# Patient Record
Sex: Female | Born: 1995 | Race: Black or African American | Hispanic: No | Marital: Single | Smoking: Never smoker
Health system: Southern US, Community
[De-identification: ages and names within clinical notes are randomized; demographics above are authoritative.]

## PROBLEM LIST (undated history)

## (undated) DIAGNOSIS — Z8759 Personal history of other complications of pregnancy, childbirth and the puerperium: Secondary | ICD-10-CM

## (undated) DIAGNOSIS — Z3490 Encounter for supervision of normal pregnancy, unspecified, unspecified trimester: Secondary | ICD-10-CM

## (undated) DIAGNOSIS — F419 Anxiety disorder, unspecified: Secondary | ICD-10-CM

## (undated) DIAGNOSIS — G43909 Migraine, unspecified, not intractable, without status migrainosus: Secondary | ICD-10-CM

## (undated) HISTORY — PX: OTHER SURGICAL HISTORY: SHX169

## (undated) HISTORY — DX: Personal history of other complications of pregnancy, childbirth and the puerperium: Z87.59

## (undated) HISTORY — DX: Migraine, unspecified, not intractable, without status migrainosus: G43.909

## (undated) HISTORY — DX: Anxiety disorder, unspecified: F41.9

---

## 2015-12-28 ENCOUNTER — Encounter (HOSPITAL_COMMUNITY): Payer: Self-pay | Admitting: Emergency Medicine

## 2015-12-28 ENCOUNTER — Ambulatory Visit (HOSPITAL_COMMUNITY)
Admission: EM | Admit: 2015-12-28 | Discharge: 2015-12-28 | Disposition: A | Discharge status: Home / Self Care | Payer: No Typology Code available for payment source | Attending: Family Medicine | Admitting: Family Medicine

## 2015-12-28 DIAGNOSIS — R51 Headache: Secondary | ICD-10-CM | POA: Diagnosis not present

## 2015-12-28 DIAGNOSIS — R519 Headache, unspecified: Secondary | ICD-10-CM

## 2015-12-28 MED ORDER — KETOROLAC TROMETHAMINE 30 MG/ML IJ SOLN
30.0000 mg | Freq: Once | INTRAMUSCULAR | Status: AC
  Administered 2015-12-28: 30 mg via INTRAMUSCULAR

## 2015-12-28 MED ORDER — TRAZODONE HCL 50 MG PO TABS: 50.0000 mg | ORAL_TABLET | Freq: Every evening | ORAL | Status: DC | PRN

## 2015-12-28 MED ORDER — METHYLPREDNISOLONE ACETATE 80 MG/ML IJ SUSP
INTRAMUSCULAR | Status: AC
  Filled 2015-12-28: qty 1

## 2015-12-28 MED ORDER — ONDANSETRON 4 MG PO TBDP
ORAL_TABLET | ORAL | Status: AC
  Filled 2015-12-28: qty 1

## 2015-12-28 MED ORDER — ONDANSETRON 4 MG PO TBDP
4.0000 mg | ORAL_TABLET | Freq: Once | ORAL | Status: AC
  Administered 2015-12-28: 4 mg via ORAL

## 2015-12-28 MED ORDER — METHYLPREDNISOLONE ACETATE 80 MG/ML IJ SUSP
80.0000 mg | Freq: Once | INTRAMUSCULAR | Status: AC
  Administered 2015-12-28: 80 mg via INTRAMUSCULAR

## 2015-12-28 MED ORDER — KETOROLAC TROMETHAMINE 30 MG/ML IJ SOLN
INTRAMUSCULAR | Status: AC
  Filled 2015-12-28: qty 1

## 2015-12-28 NOTE — ED Notes (Signed)
Complains of severe headache for 4 days.

## 2015-12-28 NOTE — ED Notes (Signed)
Verified with dr Artis Flockkindl a note to be provided for today

## 2015-12-28 NOTE — ED Provider Notes (Signed)
CSN: [id number]     Arrival date & time 12/28/15  1259 History   First MD Initiated Contact with Patient 12/28/15 1328     No chief complaint on file.  (Consider location/radiation/quality/duration/timing/severity/associated sxs/prior Treatment) Patient is a 20 y.o. female presenting with headaches. The history is provided by the patient.  Headache Pain location:  Frontal Quality:  Dull Radiates to:  Eyes Onset quality:  Gradual Duration:  5 days Progression:  Unchanged Chronicity:  New Similar to prior headaches: no   Relieved by:  None tried Worsened by:  Nothing Ineffective treatments:  None tried Associated symptoms: nausea and sinus pressure   Associated symptoms: no abdominal pain, no blurred vision, no diarrhea, no dizziness, no drainage, no neck stiffness, no numbness, no paresthesias, no photophobia, no vomiting and no weakness     No past medical history on file. No past surgical history on file. No family history on file. Social History  Substance Use Topics  . Smoking status: Not on file  . Smokeless tobacco: Not on file  . Alcohol Use: Not on file   OB History    No data available     Review of Systems  Constitutional: Negative.   HENT: Positive for sinus pressure. Negative for postnasal drip.   Eyes: Negative for blurred vision and photophobia.  Respiratory: Negative.   Gastrointestinal: Positive for nausea. Negative for vomiting, abdominal pain and diarrhea.  Genitourinary: Negative.   Musculoskeletal: Negative for neck stiffness.  Neurological: Positive for headaches. Negative for dizziness, syncope, facial asymmetry, weakness, light-headedness, numbness and paresthesias.  All other systems reviewed and are negative.   Allergies  Review of patient's allergies indicates not on file.  Home Medications   Prior to Admission medications   Not on File   Meds Ordered and Administered this Visit   Medications  ketorolac (TORADOL) 30 MG/ML injection  30 mg (not administered)  methylPREDNISolone acetate (DEPO-MEDROL) injection 80 mg (not administered)  ondansetron (ZOFRAN-ODT) disintegrating tablet 4 mg (not administered)    BP 123/81 mmHg  Pulse 99  Temp(Src) 98.4 F (36.9 C) (Oral)  Resp 16  SpO2 100% No data found.   Physical Exam  Constitutional: She is oriented to person, place, and time. She appears well-developed and well-nourished. No distress.  HENT:  Head: Normocephalic.  Right Ear: External ear normal.  Left Ear: External ear normal.  Mouth/Throat: Oropharynx is clear and moist.  Eyes: Conjunctivae and EOM are normal. Pupils are equal, round, and reactive to light.  Neck: Normal range of motion. Neck supple.  Cardiovascular: Normal heart sounds.   Pulmonary/Chest: Breath sounds normal.  Abdominal: Soft. Bowel sounds are normal. There is no tenderness.  Lymphadenopathy:    She has no cervical adenopathy.  Neurological: She is alert and oriented to person, place, and time. She displays normal reflexes. No cranial nerve deficit. She exhibits normal muscle tone. Coordination normal.  Skin: Skin is warm and dry.  Nursing note and vitals reviewed.   ED Course  Procedures (including critical care time)  Labs Review Labs Reviewed - No data to display  Imaging Review No results found.   Visual Acuity Review  Right Eye Distance:   Left Eye Distance:   Bilateral Distance:    Right Eye Near:   Left Eye Near:    Bilateral Near:         MDM  No diagnosis found. Meds ordered this encounter  Medications  . ketorolac (TORADOL) 30 MG/ML injection 30 mg    Sig:   .  methylPREDNISolone acetate (DEPO-MEDROL) injection 80 mg    Sig:   . ondansetron (ZOFRAN-ODT) disintegrating tablet 4 mg    Sig:   . traZODone (DESYREL) 50 MG tablet    Sig: Take 1 tablet (50 mg total) by mouth at bedtime as needed for sleep.    Dispense:  10 tablet    Refill:  1       Linna HoffJames D Victormanuel Mclure, MD 12/28/15 1346

## 2016-12-14 ENCOUNTER — Ambulatory Visit (HOSPITAL_COMMUNITY)
Admission: EM | Admit: 2016-12-14 | Discharge: 2016-12-14 | Disposition: A | Discharge status: Home / Self Care | Payer: BLUE CROSS/BLUE SHIELD | Attending: Family Medicine | Admitting: Family Medicine

## 2016-12-14 ENCOUNTER — Encounter (HOSPITAL_COMMUNITY): Payer: Self-pay | Admitting: Emergency Medicine

## 2016-12-14 DIAGNOSIS — J209 Acute bronchitis, unspecified: Secondary | ICD-10-CM | POA: Diagnosis not present

## 2016-12-14 HISTORY — DX: Encounter for supervision of normal pregnancy, unspecified, unspecified trimester: Z34.90

## 2016-12-14 MED ORDER — AZITHROMYCIN 250 MG PO TABS: 250.0000 mg | ORAL_TABLET | Freq: Every day | ORAL | 0 refills | Status: DC

## 2016-12-14 NOTE — ED Triage Notes (Addendum)
Coughing and wheezing started Sunday.  Patient has runny nose and post nasal drip and sneezing.    Patient is pregnant, [facility] 03/25/2017

## 2016-12-14 NOTE — ED Provider Notes (Signed)
CSN: [id number]     Arrival date & time 12/14/16  1230 History   First MD Initiated Contact with Patient 12/14/16 1253     Chief Complaint  Patient presents with  . Cough   (Consider location/radiation/quality/duration/timing/severity/associated sxs/prior Treatment) Patient c/o cough and uri sx's for a week.  She is [redacted] weeks pregnant.   The history is provided by the patient.  Cough  Cough characteristics:  Productive Sputum characteristics:  White Severity:  Mild Onset quality:  Sudden Duration:  1 day Timing:  Constant Progression:  Worsening Chronicity:  New Relieved by:  Nothing Worsened by:  Nothing   Past Medical History:  Diagnosis Date  . Pregnant    Past Surgical History:  Procedure Laterality Date  . adnoids     No family history on file. Social History  Substance Use Topics  . Smoking status: Never Smoker  . Smokeless tobacco: Not on file  . Alcohol use No   OB History    No data available     Review of Systems  Constitutional: Negative.   HENT: Negative.   Eyes: Negative.   Respiratory: Positive for cough.   Cardiovascular: Negative.   Gastrointestinal: Negative.   Endocrine: Negative.   Genitourinary: Negative.   Musculoskeletal: Negative.   Allergic/Immunologic: Negative.   Neurological: Negative.   Hematological: Negative.     Allergies  Patient has no known allergies.  Home Medications   Prior to Admission medications   Medication Sig Start Date End Date Taking? Authorizing Provider  azithromycin (ZITHROMAX) 250 MG tablet Take 1 tablet (250 mg total) by mouth daily. Take first 2 tablets together, then 1 every day until finished. 12/14/16   [name], FNP  citalopram (CELEXA) 20 MG tablet Take 20 mg by mouth daily.    Historical Provider, MD  ibuprofen (ADVIL,MOTRIN) 800 MG tablet Take 800 mg by mouth every 8 (eight) hours as needed.    Historical Provider, MD  traZODone (DESYREL) 50 MG tablet Take 1 tablet (50 mg total) by  mouth at bedtime as needed for sleep. 12/28/15   Linna Hoff, MD   Meds Ordered and Administered this Visit  Medications - No data to display  Pulse 85   Temp 99 F (37.2 C) (Oral)   Resp 18   LMP 06/18/2016   SpO2 100%  No data found.   Physical Exam  Constitutional: She is oriented to person, place, and time. She appears well-developed and well-nourished.  HENT:  Head: Normocephalic and atraumatic.  Eyes: Conjunctivae and EOM are normal. Pupils are equal, round, and reactive to light.  Neck: Normal range of motion. Neck supple.  Cardiovascular: Normal rate, regular rhythm and normal heart sounds.   Pulmonary/Chest: Effort normal and breath sounds normal.  Abdominal: Soft.  Musculoskeletal: Normal range of motion.  Neurological: She is alert and oriented to person, place, and time.  Nursing note and vitals reviewed.   Urgent Care Course     Procedures (including critical care time)  Labs Review Labs Reviewed - No data to display  Imaging Review No results found.   Visual Acuity Review  Right Eye Distance:   Left Eye Distance:   Bilateral Distance:    Right Eye Near:   Left Eye Near:    Bilateral Near:         MDM   1. Acute bronchitis, unspecified organism    zpak Delsym otc Push po fluids, rest, tylenol and motrin otc prn as directed for fever, arthralgias, and  myalgias.  Follow up prn if sx's continue or persist.    [name], FNP 12/14/16 1340

## 2017-01-16 ENCOUNTER — Encounter (HOSPITAL_COMMUNITY): Payer: Self-pay | Admitting: Emergency Medicine

## 2017-01-16 ENCOUNTER — Emergency Department (HOSPITAL_COMMUNITY)
Admission: EM | Admit: 2017-01-16 | Discharge: 2017-01-17 | Disposition: A | Discharge status: Home / Self Care | Payer: BLUE CROSS/BLUE SHIELD | Attending: Emergency Medicine | Admitting: Emergency Medicine

## 2017-01-16 DIAGNOSIS — R109 Unspecified abdominal pain: Secondary | ICD-10-CM | POA: Diagnosis not present

## 2017-01-16 DIAGNOSIS — Z3A30 30 weeks gestation of pregnancy: Secondary | ICD-10-CM | POA: Insufficient documentation

## 2017-01-16 DIAGNOSIS — Z5321 Procedure and treatment not carried out due to patient leaving prior to being seen by health care provider: Secondary | ICD-10-CM | POA: Insufficient documentation

## 2017-01-16 DIAGNOSIS — O26893 Other specified pregnancy related conditions, third trimester: Secondary | ICD-10-CM | POA: Diagnosis present

## 2017-01-16 LAB — URINALYSIS, ROUTINE W REFLEX MICROSCOPIC
Bilirubin Urine: NEGATIVE
GLUCOSE, UA: NEGATIVE mg/dL
HGB URINE DIPSTICK: NEGATIVE
Ketones, ur: 20 mg/dL — AB
LEUKOCYTES UA: NEGATIVE
Nitrite: NEGATIVE
PH: 6 (ref 5.0–8.0)
PROTEIN: NEGATIVE mg/dL
SPECIFIC GRAVITY, URINE: 1.013 (ref 1.005–1.030)

## 2017-01-16 LAB — CBC
HEMATOCRIT: 34.6 % — AB (ref 36.0–46.0)
Hemoglobin: 11.6 g/dL — ABNORMAL LOW (ref 12.0–15.0)
MCH: 30.3 pg (ref 26.0–34.0)
MCHC: 33.5 g/dL (ref 30.0–36.0)
MCV: 90.3 fL (ref 78.0–100.0)
PLATELETS: 200 10*3/uL (ref 150–400)
RBC: 3.83 MIL/uL — ABNORMAL LOW (ref 3.87–5.11)
RDW: 12.6 % (ref 11.5–15.5)
WBC: 7.7 10*3/uL (ref 4.0–10.5)

## 2017-01-16 NOTE — ED Triage Notes (Signed)
Pt to ED c/o constant sharp pains in abdomen starting today - pt is [redacted] weeks pregnant (EDD 03/25/17). Has been receiving prenatal care. Pt reports the pain started above her belly button and then progressed to below it and now all over. Pt denies vaginal discharge/bleeding/N/V/D. She does endorse pain when urinating, but can't describe it.

## 2017-01-17 LAB — COMPREHENSIVE METABOLIC PANEL
ALBUMIN: 3 g/dL — AB (ref 3.5–5.0)
ALT: 13 U/L — AB (ref 14–54)
AST: 18 U/L (ref 15–41)
Alkaline Phosphatase: 62 U/L (ref 38–126)
Anion gap: 8 (ref 5–15)
BUN: 6 mg/dL (ref 6–20)
CHLORIDE: 106 mmol/L (ref 101–111)
CO2: 20 mmol/L — AB (ref 22–32)
CREATININE: 0.54 mg/dL (ref 0.44–1.00)
Calcium: 8.4 mg/dL — ABNORMAL LOW (ref 8.9–10.3)
GFR calc Af Amer: >60 mL/min (ref >60)
GFR calc non Af Amer: >60 mL/min (ref >60)
GLUCOSE: 72 mg/dL (ref 65–99)
Potassium: 3.4 mmol/L — ABNORMAL LOW (ref 3.5–5.1)
SODIUM: 134 mmol/L — AB (ref 135–145)
Total Bilirubin: 0.4 mg/dL (ref 0.3–1.2)
Total Protein: 6.2 g/dL — ABNORMAL LOW (ref 6.5–8.1)

## 2017-01-17 LAB — HCG, QUANTITATIVE, PREGNANCY: hCG, Beta Chain, Quant, S: 11658 m[IU]/mL — ABNORMAL HIGH (ref <5)

## 2017-01-17 LAB — LIPASE, BLOOD: LIPASE: 18 U/L (ref 11–51)

## 2017-01-17 MED ORDER — SODIUM CHLORIDE 0.9 % IV SOLN: Freq: Once | INTRAVENOUS | Status: DC

## 2017-01-17 NOTE — ED Notes (Signed)
Patient is not in department

## 2017-01-17 NOTE — ED Notes (Signed)
Patient didn't answer when called for room x 2.

## 2017-02-21 ENCOUNTER — Institutional Professional Consult (permissible substitution): Payer: Self-pay | Admitting: Pediatrics

## 2017-02-24 ENCOUNTER — Ambulatory Visit (INDEPENDENT_AMBULATORY_CARE_PROVIDER_SITE_OTHER): Payer: Self-pay | Admitting: Pediatrics

## 2017-02-24 DIAGNOSIS — Z7681 Expectant parent(s) prebirth pediatrician visit: Secondary | ICD-10-CM

## 2017-02-25 NOTE — Progress Notes (Signed)
Prenatal counseling for impending newborn done--1st child, currently 36wks, no complications, prenatal at 10 wks Z76.81

## 2017-06-02 ENCOUNTER — Encounter (HOSPITAL_COMMUNITY): Payer: Self-pay | Admitting: *Deleted

## 2017-06-02 DIAGNOSIS — F419 Anxiety disorder, unspecified: Secondary | ICD-10-CM | POA: Diagnosis not present

## 2017-06-02 DIAGNOSIS — Z5321 Procedure and treatment not carried out due to patient leaving prior to being seen by health care provider: Secondary | ICD-10-CM | POA: Diagnosis not present

## 2017-06-02 DIAGNOSIS — R51 Headache: Secondary | ICD-10-CM | POA: Diagnosis present
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
## 2017-06-03 ENCOUNTER — Emergency Department (HOSPITAL_COMMUNITY)
Admission: EM | Admit: 2017-06-03 | Discharge: 2017-06-03 | Disposition: A | Discharge status: Home / Self Care | Payer: BLUE CROSS/BLUE SHIELD | Attending: Emergency Medicine | Admitting: Emergency Medicine

## 2017-06-03 NOTE — ED Notes (Signed)
Pt in adult waiting

## 2018-07-25 ENCOUNTER — Encounter (HOSPITAL_COMMUNITY): Payer: Self-pay | Admitting: Emergency Medicine

## 2018-07-25 ENCOUNTER — Ambulatory Visit (HOSPITAL_COMMUNITY)
Admission: EM | Admit: 2018-07-25 | Discharge: 2018-07-25 | Disposition: A | Discharge status: Home / Self Care | Payer: Managed Care, Other (non HMO) | Attending: Family Medicine | Admitting: Family Medicine

## 2018-07-25 DIAGNOSIS — G43009 Migraine without aura, not intractable, without status migrainosus: Secondary | ICD-10-CM

## 2018-07-25 MED ORDER — NAPROXEN 500 MG PO TABS: 500.0000 mg | ORAL_TABLET | Freq: Two times a day (BID) | ORAL | 0 refills | Status: DC

## 2018-07-25 MED ORDER — KETOROLAC TROMETHAMINE 60 MG/2ML IM SOLN
60.0000 mg | Freq: Once | INTRAMUSCULAR | Status: AC
  Administered 2018-07-25: 60 mg via INTRAMUSCULAR

## 2018-07-25 MED ORDER — KETOROLAC TROMETHAMINE 60 MG/2ML IM SOLN
INTRAMUSCULAR | Status: AC
  Filled 2018-07-25: qty 2

## 2018-07-25 NOTE — ED Triage Notes (Signed)
Triaged by provider  

## 2018-07-25 NOTE — Discharge Instructions (Signed)
We gave you a shot of Toradol today, this should begin working approximately 30 to 40 minutes May continue to use Naprosyn twice daily with food on your stomach for further headache management Please follow-up if headache not resolving, worsening, developing vision changes, neck stiffness, fevers, dizziness, lightheadedness

## 2018-07-26 NOTE — ED Provider Notes (Signed)
[facility]    CSN: [id number] Arrival date & time: 07/25/18  1317     History   Chief Complaint Chief Complaint  Patient presents with  . Migraine    HPI Christina Berry is a 22 y.o. female no significant past medical history presenting today for evaluation of headache.  Patient states that she has had a persistent headache since Monday over the past 2 to 3 days.  Onset has been gradual.  Feels like a typical migraine.  Has not been relieved with over-the-counter medicines like other milder headaches.  She states that she has had a similar migraine previous to this a few years ago that was treated with an injection that helped her symptoms.  Headache is located at the right temple.  Does not have associated photophobia or phonophobia.  Denies nausea or vomiting.  Denies changes in vision.  Denies neck stiffness or fever.  He has tried ibuprofen with mild relief, but symptoms persist.  Patient is currently on her menstrual cycle, denies pregnancy despite chart noting this.  HPI  Past Medical History:  Diagnosis Date  . Pregnant     There are no active problems to display for this patient.   Past Surgical History:  Procedure Laterality Date  . adnoids      OB History    Gravida  1   Para      Term      Preterm      AB      Living        SAB      TAB      Ectopic      Multiple      Live Births               Home Medications    Prior to Admission medications   Medication Sig Start Date End Date Taking? Authorizing Provider  azithromycin (ZITHROMAX) 250 MG tablet Take 1 tablet (250 mg total) by mouth daily. Take first 2 tablets together, then 1 every day until finished. 12/14/16   [name], FNP  citalopram (CELEXA) 20 MG tablet Take 20 mg by mouth daily.    [provider]  ibuprofen (ADVIL,MOTRIN) 800 MG tablet Take 800 mg by mouth every 8 (eight) hours as needed.    [provider]  naproxen (NAPROSYN) 500 MG  tablet Take 1 tablet (500 mg total) by mouth 2 (two) times daily. 07/25/18   Terek Bee C, PA-C  traZODone (DESYREL) 50 MG tablet Take 1 tablet (50 mg total) by mouth at bedtime as needed for sleep. 12/28/15   Linna Hoff, MD    Family History No family history on file.  Social History Social History   Tobacco Use  . Smoking status: Never Smoker  . Smokeless tobacco: Never Used  Substance Use Topics  . Alcohol use: No  . Drug use: No     Allergies   Patient has no known allergies.   Review of Systems Review of Systems  Constitutional: Negative for fatigue and fever.  HENT: Negative for congestion, sinus pressure and sore throat.   Eyes: Negative for photophobia, pain and visual disturbance.  Respiratory: Negative for cough and shortness of breath.   Cardiovascular: Negative for chest pain.  Gastrointestinal: Negative for abdominal pain, nausea and vomiting.  Genitourinary: Negative for decreased urine volume and hematuria.  Musculoskeletal: Negative for myalgias, neck pain and neck stiffness.  Neurological: Positive for headaches. Negative for dizziness, syncope, facial asymmetry,  speech difficulty, weakness, light-headedness and numbness.     Physical Exam Triage Vital Signs ED Triage Vitals  Enc Vitals Group     BP 07/25/18 1436 119/71     Pulse --      Resp 07/25/18 1436 18     Temp 07/25/18 1436 98.4 F (36.9 C)     Temp Source 07/25/18 1436 Oral     SpO2 07/25/18 1436 100 %     Weight --      Height --      Head Circumference --      Peak Flow --      Pain Score 07/25/18 1513 8     Pain Loc --      Pain Edu? --      Excl. in GC? --    No data found.  Updated Vital Signs BP 119/71 (BP Location: Left Arm)   Temp 98.4 F (36.9 C) (Oral)   Resp 18   LMP 07/25/2018   SpO2 100%   Breastfeeding? Unknown   Visual Acuity Right Eye Distance:   Left Eye Distance:   Bilateral Distance:    Right Eye Near:   Left Eye Near:    Bilateral Near:       Physical Exam  Constitutional: She is oriented to person, place, and time. She appears well-developed and well-nourished. No distress.  HENT:  Head: Normocephalic and atraumatic.  Bilateral ears without tenderness to palpation of external auricle, tragus and mastoid, EAC's without erythema or swelling, TM's with good bony landmarks and cone of light. Non erythematous.  Oral mucosa pink and moist, no tonsillar enlargement or exudate. Posterior pharynx patent and nonerythematous, no uvula deviation or swelling. Normal phonation.   Eyes: Pupils are equal, round, and reactive to light. Conjunctivae and EOM are normal.  Neck: Normal range of motion. Neck supple.  Cardiovascular: Normal rate and regular rhythm.  No murmur heard. Pulmonary/Chest: Effort normal and breath sounds normal. No respiratory distress.  Breathing comfortably at rest, CTABL, no wheezing, rales or other adventitious sounds auscultated  Abdominal: Soft. There is no tenderness.  Musculoskeletal: She exhibits no edema.  Neurological: She is alert and oriented to person, place, and time.  Patient A&O x3, cranial nerves II-XII grossly intact, strength at shoulders, hips and knees 5/5, equal bilaterally, patellar reflex 2+ bilaterally. Gait without abnormality.  Skin: Skin is warm and dry.  Psychiatric: She has a normal mood and affect.  Nursing note and vitals reviewed.    UC Treatments / Results  Labs (all labs ordered are listed, but only abnormal results are displayed) Labs Reviewed - No data to display  EKG None  Radiology No results found.  Procedures Procedures (including critical care time)  Medications Ordered in UC Medications  ketorolac (TORADOL) injection 60 mg (60 mg Intramuscular Given 07/25/18 1513)    Initial Impression / Assessment and Plan / UC Course  I have reviewed the triage vital signs and the nursing notes.  Pertinent labs & imaging results that were available during my care of the  patient were reviewed by me and considered in my medical decision making (see chart for details).     Patient with migraine, no neuro deficit, vital signs stable, will treat with Toradol today.  Continue Naprosyn at home.Discussed strict return precautions. Patient verbalized understanding and is agreeable with plan.  Final Clinical Impressions(s) / UC Diagnoses   Final diagnoses:  Migraine without aura and without status migrainosus, not intractable     Discharge Instructions  We gave you a shot of Toradol today, this should begin working approximately 30 to 40 minutes May continue to use Naprosyn twice daily with food on your stomach for further headache management Please follow-up if headache not resolving, worsening, developing vision changes, neck stiffness, fevers, dizziness, lightheadedness   ED Prescriptions    Medication Sig Dispense Auth. Provider   naproxen (NAPROSYN) 500 MG tablet Take 1 tablet (500 mg total) by mouth 2 (two) times daily. 30 tablet Cobi Delph, [place] C, PA-C     Controlled Substance Prescriptions  Controlled Substance Registry consulted? Not Applicable   Lew Dawes, [place] 07/26/18 1001

## 2019-07-19 ENCOUNTER — Other Ambulatory Visit: Payer: Self-pay

## 2019-07-19 ENCOUNTER — Encounter: Payer: Self-pay | Admitting: Family Medicine

## 2019-07-19 ENCOUNTER — Ambulatory Visit (INDEPENDENT_AMBULATORY_CARE_PROVIDER_SITE_OTHER): Payer: Medicaid Other | Admitting: Family Medicine

## 2019-07-19 VITALS — BP 107/68 | HR 93 | Temp 97.9°F | Ht 69.0 in | Wt 148.0 lb

## 2019-07-19 DIAGNOSIS — G43809 Other migraine, not intractable, without status migrainosus: Secondary | ICD-10-CM

## 2019-07-19 DIAGNOSIS — Z8759 Personal history of other complications of pregnancy, childbirth and the puerperium: Secondary | ICD-10-CM

## 2019-07-19 DIAGNOSIS — F419 Anxiety disorder, unspecified: Secondary | ICD-10-CM

## 2019-07-19 DIAGNOSIS — Z0000 Encounter for general adult medical examination without abnormal findings: Secondary | ICD-10-CM

## 2019-07-19 DIAGNOSIS — N912 Amenorrhea, unspecified: Secondary | ICD-10-CM | POA: Diagnosis not present

## 2019-07-19 DIAGNOSIS — Z09 Encounter for follow-up examination after completed treatment for conditions other than malignant neoplasm: Secondary | ICD-10-CM

## 2019-07-19 DIAGNOSIS — Z7689 Persons encountering health services in other specified circumstances: Secondary | ICD-10-CM | POA: Diagnosis not present

## 2019-07-19 LAB — POCT URINALYSIS DIPSTICK
Glucose, UA: NEGATIVE
Leukocytes, UA: NEGATIVE
Nitrite, UA: NEGATIVE
Protein, UA: NEGATIVE
Spec Grav, UA: >=1.03 — AB (ref 1.010–1.025)
Urobilinogen, UA: 0.2 E.U./dL
pH, UA: 5.5 (ref 5.0–8.0)

## 2019-07-19 LAB — POCT URINE PREGNANCY: Preg Test, Ur: NEGATIVE

## 2019-07-19 MED ORDER — IBUPROFEN 800 MG PO TABS: 800.0000 mg | ORAL_TABLET | Freq: Three times a day (TID) | ORAL | 3 refills | Status: AC | PRN

## 2019-07-19 MED ORDER — BUTALBITAL-APAP-CAFFEINE 50-325-40 MG PO TABS: 1.0000 | ORAL_TABLET | Freq: Four times a day (QID) | ORAL | 0 refills | Status: AC | PRN

## 2019-07-19 MED ORDER — BUSPIRONE HCL 5 MG PO TABS: 5.0000 mg | ORAL_TABLET | Freq: Two times a day (BID) | ORAL | 2 refills | Status: DC

## 2019-07-19 NOTE — Progress Notes (Signed)
Patient [facility]  New Patient--Hospital Follow Up--Establish Care  Subjective:  Patient ID: Christina Berry, female    DOB: Aug 26, 1996  Age: 23 y.o. MRN: [id number]  CC:  Chief Complaint  Patient presents with  . Establish Care  . Migraine    HPI Christina Berry is a 23 year old female who presents  Hospital Follow Up and to [place] today.   Past Medical History:  Diagnosis Date  . Migraine   . Miscarriage within last 12 months   . Pregnant    Current Status: This will be Christina Berry's initial office visit with me. She was previously seeing her Pediatrician for her PCP needs. Since her last office visit she recently had a miscarriage 3 and 1/2 weeks ago. She was [redacted] weeks pregnant at the time. She has a 10 year old son.  She denies vaginal pain or bleeding, and cramping today. She has follow up with OB-GYN on 07/24/2019. Her was previously using the Patch for birth control when she became pregnant.    She denies fevers, chills, fatigue, recent infections, weight loss, and night sweats. She has not had any headaches, visual changes, dizziness, and falls. No chest pain, heart palpitations, cough and shortness of breath reported. No reports of GI problems such as nausea, vomiting, diarrhea, and constipation. She has no reports of blood in stools, dysuria and hematuria. No depression or anxiety, and denies suicidal ideations, homicidal ideations, or auditory hallucinations. She denies pain today.   Past Surgical History:  Procedure Laterality Date  . adnoids      History reviewed. No pertinent family history.  Social History   Socioeconomic History  . Marital status: Single    Spouse name: Not on file  . Number of children: Not on file  . Years of education: Not on file  . Highest education level: Not on file  Occupational History  . Not on file  Social Needs  . Financial resource strain: Not on file  . Food insecurity    Worry: Not on  file    Inability: Not on file  . Transportation needs    Medical: Not on file    Non-medical: Not on file  Tobacco Use  . Smoking status: Never Smoker  . Smokeless tobacco: Never Used  Substance and Sexual Activity  . Alcohol use: No  . Drug use: No  . Sexual activity: Yes    Partners: Male    Birth control/protection: Patch  Lifestyle  . Physical activity    Days per week: Not on file    Minutes per session: Not on file  . Stress: Not on file  Relationships  . Social Herbalist on phone: Not on file    Gets together: Not on file    Attends religious service: Not on file    Active member of club or organization: Not on file    Attends meetings of clubs or organizations: Not on file    Relationship status: Not on file  . Intimate partner violence    Fear of current or ex partner: Not on file    Emotionally abused: Not on file    Physically abused: Not on file    Forced sexual activity: Not on file  Other Topics Concern  . Not on file  Social History Narrative  . Not on file    Outpatient Medications Prior to Visit  Medication Sig Dispense Refill  . ibuprofen (ADVIL,MOTRIN) 800  MG tablet Take 800 mg by mouth every 8 (eight) hours as needed.    . traZODone (DESYREL) 50 MG tablet Take 1 tablet (50 mg total) by mouth at bedtime as needed for sleep. (Patient not taking: Reported on 07/19/2019) 10 tablet 1  . azithromycin (ZITHROMAX) 250 MG tablet Take 1 tablet (250 mg total) by mouth daily. Take first 2 tablets together, then 1 every day until finished. 6 tablet 0  . citalopram (CELEXA) 20 MG tablet Take 20 mg by mouth daily.    . naproxen (NAPROSYN) 500 MG tablet Take 1 tablet (500 mg total) by mouth 2 (two) times daily. 30 tablet 0   No facility-administered medications prior to visit.     Allergies  Allergen Reactions  . Cat Hair Extract Swelling  . Seasonal Ic  [Cholestatin] Swelling    ROS Review of Systems  Constitutional: Negative.   HENT:  Negative.   Eyes: Negative.   Respiratory: Negative.   Cardiovascular: Negative.   Gastrointestinal: Negative.   Endocrine: Negative.   Genitourinary: Negative.   Musculoskeletal: Negative.   Skin: Negative.   Allergic/Immunologic: Negative.   Neurological: Positive for headaches (Migraine).  Hematological: Negative.   Psychiatric/Behavioral: Negative.    Objective:    Physical Exam  Constitutional: She is oriented to person, place, and time. She appears well-developed and well-nourished.  HENT:  Head: Normocephalic and atraumatic.  Eyes: Conjunctivae are normal.  Neck: Normal range of motion. Neck supple.  Cardiovascular: Normal rate, regular rhythm, normal heart sounds and intact distal pulses.  Pulmonary/Chest: Effort normal and breath sounds normal.  Abdominal: Soft. Bowel sounds are normal.  Musculoskeletal: Normal range of motion.  Neurological: She is alert and oriented to person, place, and time. She has normal reflexes.  Skin: Skin is warm and dry.  Psychiatric: She has a normal mood and affect. Her behavior is normal. Judgment and thought content normal.  Nursing note and vitals reviewed.   BP 107/68   Pulse 93   Temp 97.9 F (36.6 C) (Oral)   Ht _0  (1.753 m)   Wt 148 lb (67.1 kg)   LMP 03/27/2019 (Approximate)   SpO2 99%   BMI 21.86 kg/m  Wt Readings from Last 3 Encounters:  07/19/19 148 lb (67.1 kg)  06/02/17 138 lb (62.6 kg)    Health Maintenance Due  Topic Date Due  . HIV Screening  11/05/2010  . TETANUS/TDAP  11/05/2014  . PAP-Cervical Cytology Screening  11/04/2016  . PAP SMEAR-Modifier  11/04/2016  . INFLUENZA VACCINE  04/06/2019    There are no preventive care reminders to display for this patient.  No results found for: TSH Lab Results  Component Value Date   WBC 4.4 07/19/2019   HGB 13.2 07/19/2019   HCT 40.3 07/19/2019   MCV 91 07/19/2019   PLT 217 07/19/2019   Lab Results  Component Value Date   NA 139 07/19/2019   K 4.0  07/19/2019   CO2 22 07/19/2019   GLUCOSE 84 07/19/2019   BUN 10 07/19/2019   CREATININE 0.81 07/19/2019   BILITOT 0.4 07/19/2019   ALKPHOS 56 07/19/2019   AST 11 07/19/2019   ALT 6 07/19/2019   PROT 6.8 07/19/2019   ALBUMIN 4.5 07/19/2019   CALCIUM 9.7 07/19/2019   ANIONGAP 8 01/16/2017   No results found for: CHOL No results found for: HDL No results found for: LDLCALC No results found for: TRIG No results found for: CHOLHDL No results found for: HGBA1C  Assessment & Plan:  1. Hospital discharge follow-up  2. Encounter to establish care  3. History of miscarriage She is doing well today.   4. Absent menstruation Urine pregnancy test is negative for today.  - POCT urine pregnancy  5. Other migraine without status migrainosus, not intractable We will initiate Fioricet today.  - butalbital-acetaminophen-caffeine (FIORICET) 50-325-40 MG tablet; Take 1-2 tablets by mouth every 6 (six) hours as needed for headache.  Dispense: 20 tablet; Refill: 0  6. Anxiety Mild today. We will initiate Buspirone today.  - busPIRone (BUSPAR) 5 MG tablet; Take 1 tablet (5 mg total) by mouth 2 (two) times daily.  Dispense: 60 tablet; Refill: 2  7. Healthcare maintenance - CBC with Differential - Comp Met (CMET) - Urinalysis Dipstick - POCT urine pregnancy  8. Follow up She will follow up in 2 months.   Meds ordered this encounter  Medications  . ibuprofen (ADVIL) 800 MG tablet    Sig: Take 1 tablet (800 mg total) by mouth every 8 (eight) hours as needed for headache.    Dispense:  30 tablet    Refill:  3  . busPIRone (BUSPAR) 5 MG tablet    Sig: Take 1 tablet (5 mg total) by mouth 2 (two) times daily.    Dispense:  60 tablet    Refill:  2  . butalbital-acetaminophen-caffeine (FIORICET) 50-325-40 MG tablet    Sig: Take 1-2 tablets by mouth every 6 (six) hours as needed for headache.    Dispense:  20 tablet    Refill:  0    Orders Placed This Encounter  Procedures  . CBC  with Differential  . Comp Met (CMET)  . Urinalysis Dipstick  . POCT urine pregnancy    Referral Orders  No referral(s) requested today    Kathe Becton,  MSN, FNP-BC [address] [place], [place] [address] [phone] fax   Problem List Items Addressed This Visit    None    Visit Diagnoses    Hospital discharge follow-up    -  Primary   Encounter to establish care       History of miscarriage       Absent menstruation       Relevant Orders   POCT urine pregnancy (Completed)   Other migraine without status migrainosus, not intractable       Relevant Medications   ibuprofen (ADVIL) 800 MG tablet   butalbital-acetaminophen-caffeine (FIORICET) 50-325-40 MG tablet   Anxiety       Relevant Medications   busPIRone (BUSPAR) 5 MG tablet   Healthcare maintenance       Relevant Orders   CBC with Differential (Completed)   Comp Met (CMET) (Completed)   Health care maintenance       Relevant Orders   Urinalysis Dipstick (Completed)   POCT urine pregnancy (Completed)   Follow up          Meds ordered this encounter  Medications  . ibuprofen (ADVIL) 800 MG tablet    Sig: Take 1 tablet (800 mg total) by mouth every 8 (eight) hours as needed for headache.    Dispense:  30 tablet    Refill:  3  . busPIRone (BUSPAR) 5 MG tablet    Sig: Take 1 tablet (5 mg total) by mouth 2 (two) times daily.    Dispense:  60 tablet    Refill:  2  . butalbital-acetaminophen-caffeine (FIORICET) 50-325-40 MG tablet  Sig: Take 1-2 tablets by mouth every 6 (six) hours as needed for headache.    Dispense:  20 tablet    Refill:  0    Follow-up: Return in about 2 months (around 09/18/2019).    Azzie Glatter, FNP

## 2019-07-19 NOTE — Patient Instructions (Addendum)
Contraception Choices Contraception, also called birth control, means things to use or ways to try not to get pregnant. Hormonal birth control This kind of birth control uses hormones. Here are some types of hormonal birth control:  A tube that is put under skin of the arm (implant). The tube can stay in for as long as 3 years.  Shots to get every 3 months (injections).  Pills to take every day (birth control pills).  A patch to change 1 time each week for 3 weeks (birth control patch). After that, the patch is taken off for 1 week.  A ring to put in the vagina. The ring is left in for 3 weeks. Then it is taken out of the vagina for 1 week. Then a new ring is put in.  Pills to take after unprotected sex (emergency birth control pills). Barrier birth control Here are some types of barrier birth control:  A thin covering that is put on the penis before sex (female condom). The covering is thrown away after sex.  A soft, loose covering that is put in the vagina before sex (female condom). The covering is thrown away after sex.  A rubber bowl that sits over the cervix (diaphragm). The bowl must be made for you. The bowl is put into the vagina before sex. The bowl is left in for 6-8 hours after sex. It is taken out within 24 hours.  A small, soft cup that fits over the cervix (cervical cap). The cup must be made for you. The cup can be left in for 6-8 hours after sex. It is taken out within 48 hours.  A sponge that is put into the vagina before sex. It must be left in for at least 6 hours after sex. It must be taken out within 30 hours. Then it is thrown away.  A chemical that kills or stops sperm from getting into the uterus (spermicide). It may be a pill, cream, jelly, or foam to put in the vagina. The chemical should be used at least 10-15 minutes before sex. IUD (intrauterine) birth control An IUD is a small, T-shaped piece of plastic. It is put inside the uterus. There are two kinds:   Hormone IUD. This kind can stay in for 3-5 years.  Copper IUD. This kind can stay in for 10 years. Permanent birth control Here are some types of permanent birth control:  Surgery to block the fallopian tubes.  Having an insert put into each fallopian tube.  Surgery to tie off the tubes that carry sperm (vasectomy). Natural planning birth control Here are some types of natural planning birth control:  Not having sex on the days the woman could get pregnant.  Using a calendar: ? To keep track of the length of each period. ? To find out what days pregnancy can happen. ? To plan to not have sex on days when pregnancy can happen.  Watching for symptoms of ovulation and not having sex during ovulation. One way the woman can check for ovulation is to check her temperature.  Waiting to have sex until after ovulation. Summary  Contraception, also called birth control, means things to use or ways to try not to get pregnant.  Hormonal methods of birth control include implants, injections, pills, patches, vaginal rings, and emergency birth control pills.  Barrier methods of birth control can include female condoms, female condoms, diaphragms, cervical caps, sponges, and spermicides.  There are two types of IUD (intrauterine device) birth control.  An IUD can be put in a woman's uterus to prevent pregnancy for 3-5 years.  Permanent sterilization can be done through a procedure for males, females, or both.  Natural planning methods involve not having sex on the days when the woman could get pregnant. This information is not intended to replace advice given to you by your health care provider. Make sure you discuss any questions you have with your health care provider. Document Released: 06/19/2009 Document Revised: 12/12/2018 Document Reviewed: 09/01/2016 Elsevier Patient Education  2020 Elsevier Inc. Acetaminophen; Butalbital; Caffeine tablets or capsules What is this medicine?  ACETAMINOPHEN; BUTALBITAL; CAFFEINE (a set a MEE noe fen; byoo TAL bi tal; KAF een) is a pain reliever. It is used to treat tension headaches. This medicine may be used for other purposes; ask your health care provider or pharmacist if you have questions. COMMON BRAND NAME(S): Alagesic, Americet, Anolor-300, Arcet, BAC, CAPACET, Dolgic Plus, Esgic, Esgic Plus, Ezol, Fioricet, Ryder System, Medigesic, [place], [address] [place], Phrenilin Forte, Repan, [place], Triad, Zebutal What should I tell my health care provider before I take this medicine? They need to know if you have any of these conditions:  drug abuse or addiction  heart or circulation problems  if you often drink alcohol  kidney disease or problems going to the bathroom  liver disease  lung disease, asthma, or breathing problems  porphyria  an unusual or allergic reaction to acetaminophen, butalbital or other barbiturates, caffeine, other medicines, foods, dyes, or preservatives  pregnant or trying to get pregnant  breast-feeding How should I use this medicine? Take this medicine by mouth with a full glass of water. Follow the directions on the prescription label. If the medicine upsets your stomach, take the medicine with food or milk. Do not take more than you are told to take. Talk to your pediatrician regarding the use of this medicine in children. Special care may be needed. Overdosage: If you think you have taken too much of this medicine contact a poison control center or emergency room at once. NOTE: This medicine is only for you. Do not share this medicine with others. What if I miss a dose? If you miss a dose, take it as soon as you can. If it is almost time for your next dose, take only that dose. Do not take double or extra doses. What may interact with this medicine?  alcohol or medicines that contain alcohol  antidepressants, especially MAOIs like isocarboxazid, phenelzine, tranylcypromine, and selegiline   antihistamines  benzodiazepines  carbamazepine  isoniazid  medicines for pain like pentazocine, buprenorphine, butorphanol, nalbuphine, tramadol, and propoxyphene  muscle relaxants  naltrexone  phenobarbital, phenytoin, and fosphenytoin  phenothiazines like perphenazine, thioridazine, chlorpromazine, mesoridazine, fluphenazine, prochlorperazine, promazine, and trifluoperazine  voriconazole This list may not describe all possible interactions. Give your health care provider a list of all the medicines, herbs, non-prescription drugs, or dietary supplements you use. Also tell them if you smoke, drink alcohol, or use illegal drugs. Some items may interact with your medicine. What should I watch for while using this medicine? Tell your doctor or health care professional if your pain does not go away, if it gets worse, or if you have new or a different type of pain. You may develop tolerance to the medicine. Tolerance means that you will need a higher dose of the medicine for pain relief. Tolerance is normal and is expected if you take the medicine for a long time. Do not suddenly stop taking your medicine because you may  develop a severe reaction. Your body becomes used to the medicine. This does NOT mean you are addicted. Addiction is a behavior related to getting and using a drug for a non-medical reason. If you have pain, you have a medical reason to take pain medicine. Your doctor will tell you how much medicine to take. If your doctor wants you to stop the medicine, the dose will be slowly lowered over time to avoid any side effects. You may get drowsy or dizzy when you first start taking the medicine or change doses. Do not drive, use machinery, or do anything that may be dangerous until you know how the medicine affects you. Stand or sit up slowly. Do not take other medicines that contain acetaminophen with this medicine. Always read labels carefully. If you have questions, ask your doctor  or pharmacist. If you take too much acetaminophen get medical help right away. Too much acetaminophen can be very dangerous and cause liver damage. Even if you do not have symptoms, it is important to get help right away. What side effects may I notice from receiving this medicine? Side effects that you should report to your doctor or health care professional as soon as possible:  allergic reactions like skin rash, itching or hives, swelling of the face, lips, or tongue  breathing problems  confusion  feeling faint or lightheaded, falls  redness, blistering, peeling or loosening of the skin, including inside the mouth  seizure  stomach pain  yellowing of the eyes or skin Side effects that usually do not require medical attention (report to your doctor or health care professional if they continue or are bothersome):  constipation  nausea, vomiting This list may not describe all possible side effects. Call your doctor for medical advice about side effects. You may report side effects to FDA at 1-800-FDA-1088. Where should I keep my medicine? Keep out of the reach of children. This medicine can be abused. Keep your medicine in a safe place to protect it from theft. Do not share this medicine with anyone. Selling or giving away this medicine is dangerous and against the law. This medicine may cause accidental overdose and death if it taken by other adults, children, or pets. Mix any unused medicine with a substance like cat litter or coffee grounds. Then throw the medicine away in a sealed container like a sealed bag or a coffee can with a lid. Do not use the medicine after the expiration date. Store at room temperature between 15 and 30 degrees C (59 and 86 degrees F). NOTE: This sheet is a summary. It may not cover all possible information. If you have questions about this medicine, talk to your doctor, pharmacist, or health care provider.  2020 Elsevier/Gold Standard (2013-10-18  15:00:25) Migraine Headache A migraine headache is a very strong throbbing pain on one side or both sides of your head. This type of headache can also cause other symptoms. It can last from 4 hours to 3 days. Talk with your doctor about what things may bring on (trigger) this condition. What are the causes? The exact cause of this condition is not known. This condition may be triggered or caused by:  Drinking alcohol.  Smoking.  Taking medicines, such as: ? Medicine used to treat chest pain (nitroglycerin). ? Birth control pills. ? Estrogen. ? Some blood pressure medicines.  Eating or drinking certain products.  Doing physical activity. Other things that may trigger a migraine headache include:  Having a menstrual period.  Pregnancy.  Hunger.  Stress.  Not getting enough sleep or getting too much sleep.  Weather changes.  Tiredness (fatigue). What increases the risk?  Being 66-64 years old.  Being female.  Having a family history of migraine headaches.  Being Caucasian.  Having depression or anxiety.  Being very overweight. What are the signs or symptoms?  A throbbing pain. This pain may: ? Happen in any area of the head, such as on one side or both sides. ? Make it hard to do daily activities. ? Get worse with physical activity. ? Get worse around bright lights or loud noises.  Other symptoms may include: ? Feeling sick to your stomach (nauseous). ? Vomiting. ? Dizziness. ? Being sensitive to bright lights, loud noises, or smells.  Before you get a migraine headache, you may get warning signs (an aura). An aura may include: ? Seeing flashing lights or having blind spots. ? Seeing bright spots, halos, or zigzag lines. ? Having tunnel vision or blurred vision. ? Having numbness or a tingling feeling. ? Having trouble talking. ? Having weak muscles.  Some people have symptoms after a migraine headache (postdromal phase), such as: ? Tiredness. ?  Trouble thinking (concentrating). How is this treated?  Taking medicines that: ? Relieve pain. ? Relieve the feeling of being sick to your stomach. ? Prevent migraine headaches.  Treatment may also include: ? Having acupuncture. ? Avoiding foods that bring on migraine headaches. ? Learning ways to control your body functions (biofeedback). ? Therapy to help you know and deal with negative thoughts (cognitive behavioral therapy). Follow these instructions at home: Medicines  Take over-the-counter and prescription medicines only as told by your doctor.  Ask your doctor if the medicine prescribed to you: ? Requires you to avoid driving or using heavy machinery. ? Can cause trouble pooping (constipation). You may need to take these steps to prevent or treat trouble pooping:  Drink enough fluid to keep your pee (urine) pale yellow.  Take over-the-counter or prescription medicines.  Eat foods that are high in fiber. These include beans, whole grains, and fresh fruits and vegetables.  Limit foods that are high in fat and sugar. These include fried or sweet foods. Lifestyle  Do not drink alcohol.  Do not use any products that contain nicotine or tobacco, such as cigarettes, e-cigarettes, and chewing tobacco. If you need help quitting, ask your doctor.  Get at least 8 hours of sleep every night.  Limit and deal with stress. General instructions      Keep a journal to find out what may bring on your migraine headaches. For example, write down: ? What you eat and drink. ? How much sleep you get. ? Any change in what you eat or drink. ? Any change in your medicines.  If you have a migraine headache: ? Avoid things that make your symptoms worse, such as bright lights. ? It may help to lie down in a dark, quiet room. ? Do not drive or use heavy machinery. ? Ask your doctor what activities are safe for you.  Keep all follow-up visits as told by your doctor. This is important.  Contact a doctor if:  You get a migraine headache that is different or worse than others you have had.  You have more than 15 headache days in one month. Get help right away if:  Your migraine headache gets very bad.  Your migraine headache lasts longer than 72 hours.  You have a fever.  You  have a stiff neck.  You have trouble seeing.  Your muscles feel weak or like you cannot control them.  You start to lose your balance a lot.  You start to have trouble walking.  You pass out (faint).  You have a seizure. Summary  A migraine headache is a very strong throbbing pain on one side or both sides of your head. These headaches can also cause other symptoms.  This condition may be treated with medicines and changes to your lifestyle.  Keep a journal to find out what may bring on your migraine headaches.  Contact a doctor if you get a migraine headache that is different or worse than others you have had.  Contact your doctor if you have more than 15 headache days in a month. This information is not intended to replace advice given to you by your health care provider. Make sure you discuss any questions you have with your health care provider. Document Released: 05/31/2008 Document Revised: 12/14/2018 Document Reviewed: 10/04/2018 Elsevier Patient Education  2020 Elsevier Inc. Generalized Anxiety Disorder, Adult Generalized anxiety disorder (GAD) is a mental health disorder. People with this condition constantly worry about everyday events. Unlike normal anxiety, worry related to GAD is not triggered by a specific event. These worries also do not fade or get better with time. GAD interferes with life functions, including relationships, work, and school. GAD can vary from mild to severe. People with severe GAD can have intense waves of anxiety with physical symptoms (panic attacks). What are the causes? The exact cause of GAD is not known. What increases the risk? This  condition is more likely to develop in:  Women.  People who have a family history of anxiety disorders.  People who are very shy.  People who experience very stressful life events, such as the death of a loved one.  People who have a very stressful family environment. What are the signs or symptoms? People with GAD often worry excessively about many things in their lives, such as their health and family. They may also be overly concerned about:  Doing well at work.  Being on time.  Natural disasters.  Friendships. Physical symptoms of GAD include:  Fatigue.  Muscle tension or having muscle twitches.  Trembling or feeling shaky.  Being easily startled.  Feeling like your heart is pounding or racing.  Feeling out of breath or like you cannot take a deep breath.  Having trouble falling asleep or staying asleep.  Sweating.  Nausea, diarrhea, or irritable bowel syndrome (IBS).  Headaches.  Trouble concentrating or remembering facts.  Restlessness.  Irritability. How is this diagnosed? Your health care provider can diagnose GAD based on your symptoms and medical history. You will also have a physical exam. The health care provider will ask specific questions about your symptoms, including how severe they are, when they started, and if they come and go. Your health care provider may ask you about your use of alcohol or drugs, including prescription medicines. Your health care provider may refer you to a mental health specialist for further evaluation. Your health care provider will do a thorough examination and may perform additional tests to rule out other possible causes of your symptoms. To be diagnosed with GAD, a person must have anxiety that:  Is out of his or her control.  Affects several different aspects of his or her life, such as work and relationships.  Causes distress that makes him or her unable to take part in normal  activities.  Includes at least  three physical symptoms of GAD, such as restlessness, fatigue, trouble concentrating, irritability, muscle tension, or sleep problems. Before your health care provider can confirm a diagnosis of GAD, these symptoms must be present more days than they are not, and they must last for six months or longer. How is this treated? The following therapies are usually used to treat GAD:  Medicine. Antidepressant medicine is usually prescribed for long-term daily control. Antianxiety medicines may be added in severe cases, especially when panic attacks occur.  Talk therapy (psychotherapy). Certain types of talk therapy can be helpful in treating GAD by providing support, education, and guidance. Options include: ? Cognitive behavioral therapy (CBT). People learn coping skills and techniques to ease their anxiety. They learn to identify unrealistic or negative thoughts and behaviors and to replace them with positive ones. ? Acceptance and commitment therapy (ACT). This treatment teaches people how to be mindful as a way to cope with unwanted thoughts and feelings. ? Biofeedback. This process trains you to manage your body's response (physiological response) through breathing techniques and relaxation methods. You will work with a therapist while machines are used to monitor your physical symptoms.  Stress management techniques. These include yoga, meditation, and exercise. A mental health specialist can help determine which treatment is best for you. Some people see improvement with one type of therapy. However, other people require a combination of therapies. Follow these instructions at home:  Take over-the-counter and prescription medicines only as told by your health care provider.  Try to maintain a normal routine.  Try to anticipate stressful situations and allow extra time to manage them.  Practice any stress management or self-calming techniques as taught by your health care provider.  Do not  punish yourself for setbacks or for not making progress.  Try to recognize your accomplishments, even if they are small.  Keep all follow-up visits as told by your health care provider. This is important. Contact a health care provider if:  Your symptoms do not get better.  Your symptoms get worse.  You have signs of depression, such as: ? A persistently sad, cranky, or irritable mood. ? Loss of enjoyment in activities that used to bring you joy. ? Change in weight or eating. ? Changes in sleeping habits. ? Avoiding friends or family members. ? Loss of energy for normal tasks. ? Feelings of guilt or worthlessness. Get help right away if:  You have serious thoughts about hurting yourself or others. If you ever feel like you may hurt yourself or others, or have thoughts about taking your own life, get help right away. You can go to your nearest emergency department or call:  Your local emergency services (911 in the [place]  A suicide crisis helpline, such as the National Suicide Prevention Lifeline at 502-309-3858. This is open 24 hours a day. Summary  Generalized anxiety disorder (GAD) is a mental health disorder that involves worry that is not triggered by a specific event.  People with GAD often worry excessively about many things in their lives, such as their health and family.  GAD may cause physical symptoms such as restlessness, trouble concentrating, sleep problems, frequent sweating, nausea, diarrhea, headaches, and trembling or muscle twitching.  A mental health specialist can help determine which treatment is best for you. Some people see improvement with one type of therapy. However, other people require a combination of therapies. This information is not intended to replace advice given to you by your health  care provider. Make sure you discuss any questions you have with your health care provider. Document Released: 12/17/2012 Document Revised: 08/04/2017 Document  Reviewed: 07/12/2016 Elsevier Patient Education  2020 [employer] Buspirone tablets What is this medicine? BUSPIRONE (byoo SPYE rone) is used to treat anxiety disorders. This medicine may be used for other purposes; ask your health care provider or pharmacist if you have questions. COMMON BRAND NAME(S): BuSpar What should I tell my health care provider before I take this medicine? They need to know if you have any of these conditions:  kidney or liver disease  an unusual or allergic reaction to buspirone, other medicines, foods, dyes, or preservatives  pregnant or trying to get pregnant  breast-feeding How should I use this medicine? Take this medicine by mouth with a glass of water. Follow the directions on the prescription label. You may take this medicine with or without food. To ensure that this medicine always works the same way for you, you should take it either always with or always without food. Take your doses at regular intervals. Do not take your medicine more often than directed. Do not stop taking except on the advice of your doctor or health care professional. Talk to your pediatrician regarding the use of this medicine in children. Special care may be needed. Overdosage: If you think you have taken too much of this medicine contact a poison control center or emergency room at once. NOTE: This medicine is only for you. Do not share this medicine with others. What if I miss a dose? If you miss a dose, take it as soon as you can. If it is almost time for your next dose, take only that dose. Do not take double or extra doses. What may interact with this medicine? Do not take this medicine with any of the following medications:  linezolid  MAOIs like Carbex, Eldepryl, Marplan, Nardil, and Parnate  methylene blue  procarbazine This medicine may also interact with the following medications:  diazepam  digoxin  diltiazem  erythromycin  grapefruit juice   haloperidol  medicines for mental depression or mood problems  medicines for seizures like carbamazepine, phenobarbital and phenytoin  nefazodone  other medications for anxiety  rifampin  ritonavir  some antifungal medicines like itraconazole, ketoconazole, and voriconazole  verapamil  warfarin This list may not describe all possible interactions. Give your health care provider a list of all the medicines, herbs, non-prescription drugs, or dietary supplements you use. Also tell them if you smoke, drink alcohol, or use illegal drugs. Some items may interact with your medicine. What should I watch for while using this medicine? Visit your doctor or health care professional for regular checks on your progress. It may take 1 to 2 weeks before your anxiety gets better. You may get drowsy or dizzy. Do not drive, use machinery, or do anything that needs mental alertness until you know how this drug affects you. Do not stand or sit up quickly, especially if you are an older patient. This reduces the risk of dizzy or fainting spells. Alcohol can make you more drowsy and dizzy. Avoid alcoholic drinks. What side effects may I notice from receiving this medicine? Side effects that you should report to your doctor or health care professional as soon as possible:  blurred vision or other vision changes  chest pain  confusion  difficulty breathing  feelings of hostility or anger  muscle aches and pains  numbness or tingling in hands or feet  ringing in  the ears  skin rash and itching  vomiting  weakness Side effects that usually do not require medical attention (report to your doctor or health care professional if they continue or are bothersome):  disturbed dreams, nightmares  headache  nausea  restlessness or nervousness  sore throat and nasal congestion  stomach upset This list may not describe all possible side effects. Call your doctor for medical advice about side  effects. You may report side effects to FDA at 1-800-FDA-1088. Where should I keep my medicine? Keep out of the reach of children. Store at room temperature below 30 degrees C (86 degrees F). Protect from light. Keep container tightly closed. Throw away any unused medicine after the expiration date. NOTE: This sheet is a summary. It may not cover all possible information. If you have questions about this medicine, talk to your doctor, pharmacist, or health care provider.  2020 Elsevier/Gold Standard (2010-04-01 18:06:11)

## 2019-07-20 DIAGNOSIS — Z8759 Personal history of other complications of pregnancy, childbirth and the puerperium: Secondary | ICD-10-CM | POA: Insufficient documentation

## 2019-07-20 DIAGNOSIS — G43909 Migraine, unspecified, not intractable, without status migrainosus: Secondary | ICD-10-CM | POA: Insufficient documentation

## 2019-07-20 LAB — CBC WITH DIFFERENTIAL/PLATELET
Basophils Absolute: 0 10*3/uL (ref 0.0–0.2)
Basos: 1 %
EOS (ABSOLUTE): 0.1 10*3/uL (ref 0.0–0.4)
Eos: 2 %
Hematocrit: 40.3 % (ref 34.0–46.6)
Hemoglobin: 13.2 g/dL (ref 11.1–15.9)
Immature Grans (Abs): 0 10*3/uL (ref 0.0–0.1)
Immature Granulocytes: 0 %
Lymphocytes Absolute: 1.6 10*3/uL (ref 0.7–3.1)
Lymphs: 36 %
MCH: 29.7 pg (ref 26.6–33.0)
MCHC: 32.8 g/dL (ref 31.5–35.7)
MCV: 91 fL (ref 79–97)
Monocytes Absolute: 0.3 10*3/uL (ref 0.1–0.9)
Monocytes: 6 %
Neutrophils Absolute: 2.4 10*3/uL (ref 1.4–7.0)
Neutrophils: 55 %
Platelets: 217 10*3/uL (ref 150–450)
RBC: 4.44 x10E6/uL (ref 3.77–5.28)
RDW: 11.7 % (ref 11.7–15.4)
WBC: 4.4 10*3/uL (ref 3.4–10.8)

## 2019-07-20 LAB — COMPREHENSIVE METABOLIC PANEL
ALT: 6 IU/L (ref 0–32)
AST: 11 IU/L (ref 0–40)
Albumin/Globulin Ratio: 2 (ref 1.2–2.2)
Albumin: 4.5 g/dL (ref 3.9–5.0)
Alkaline Phosphatase: 56 IU/L (ref 39–117)
BUN/Creatinine Ratio: 12 (ref 9–23)
BUN: 10 mg/dL (ref 6–20)
Bilirubin Total: 0.4 mg/dL (ref 0.0–1.2)
CO2: 22 mmol/L (ref 20–29)
Calcium: 9.7 mg/dL (ref 8.7–10.2)
Chloride: 104 mmol/L (ref 96–106)
Creatinine, Ser: 0.81 mg/dL (ref 0.57–1.00)
GFR calc Af Amer: 118 mL/min/{1.73_m2} (ref >59)
GFR calc non Af Amer: 103 mL/min/{1.73_m2} (ref >59)
Globulin, Total: 2.3 g/dL (ref 1.5–4.5)
Glucose: 84 mg/dL (ref 65–99)
Potassium: 4 mmol/L (ref 3.5–5.2)
Sodium: 139 mmol/L (ref 134–144)
Total Protein: 6.8 g/dL (ref 6.0–8.5)

## 2019-07-22 ENCOUNTER — Telehealth: Payer: Self-pay

## 2019-07-22 NOTE — Telephone Encounter (Signed)
-----   Message from Azzie Glatter, FNP sent at 07/22/2019  8:41 AM EST ----- All labs are stable. Please inform patient. Thank you.

## 2019-07-22 NOTE — Telephone Encounter (Signed)
Message left for patient to call back for test results.

## 2019-09-18 ENCOUNTER — Ambulatory Visit: Payer: Medicaid Other | Admitting: Family Medicine

## 2019-12-02 ENCOUNTER — Other Ambulatory Visit: Payer: Self-pay

## 2019-12-02 ENCOUNTER — Ambulatory Visit (INDEPENDENT_AMBULATORY_CARE_PROVIDER_SITE_OTHER): Payer: Medicaid Other

## 2019-12-02 ENCOUNTER — Ambulatory Visit (HOSPITAL_COMMUNITY)
Admission: EM | Admit: 2019-12-02 | Discharge: 2019-12-02 | Disposition: A | Discharge status: Home / Self Care | Payer: Medicaid Other | Attending: Family Medicine | Admitting: Family Medicine

## 2019-12-02 ENCOUNTER — Telehealth: Payer: Self-pay

## 2019-12-02 ENCOUNTER — Encounter (HOSPITAL_COMMUNITY): Payer: Self-pay

## 2019-12-02 DIAGNOSIS — R0789 Other chest pain: Secondary | ICD-10-CM

## 2019-12-02 DIAGNOSIS — R079 Chest pain, unspecified: Secondary | ICD-10-CM | POA: Diagnosis not present

## 2019-12-02 MED ORDER — LIDOCAINE VISCOUS HCL 2 % MT SOLN
15.0000 mL | Freq: Once | OROMUCOSAL | Status: AC
  Administered 2019-12-02: 15 mL via ORAL

## 2019-12-02 MED ORDER — ALUM & MAG HYDROXIDE-SIMETH 200-200-20 MG/5ML PO SUSP
ORAL | Status: AC
  Filled 2019-12-02: qty 30

## 2019-12-02 MED ORDER — NAPROXEN 500 MG PO TABS
500.0000 mg | ORAL_TABLET | Freq: Two times a day (BID) | ORAL | 0 refills | Status: AC
Start: 2019-12-02 — End: ?

## 2019-12-02 MED ORDER — LIDOCAINE VISCOUS HCL 2 % MT SOLN
OROMUCOSAL | Status: AC
  Filled 2019-12-02: qty 15

## 2019-12-02 MED ORDER — ALUM & MAG HYDROXIDE-SIMETH 200-200-20 MG/5ML PO SUSP
30.0000 mL | Freq: Once | ORAL | Status: AC
  Administered 2019-12-02: 30 mL via ORAL

## 2019-12-02 NOTE — Telephone Encounter (Signed)
Christina Berry c/o chest pain, back pain for 4 days. Per patient she also has experienced a some nausea. Patient advised to go to the  ED or Urgent care today for evaluation.

## 2019-12-02 NOTE — Discharge Instructions (Signed)
Chest xray and EKG normal Try Naprosyn twice daily with food consistently for the next 7 to 10 days May continue to try acid/reflux medicine  If developing worsening symptoms, increased pain, difficulty breathing, dizziness or lightheadedness please follow-up in emergency room If persistent please follow-up with primary care

## 2019-12-02 NOTE — ED Triage Notes (Signed)
Pt presents with intermittent central chest pressure and upper back pain X 5 days.

## 2019-12-02 NOTE — ED Provider Notes (Signed)
[facility]    CSN: [id number] Arrival date & time: 12/02/19  1741      History   Chief Complaint Chief Complaint  Patient presents with  . Central Chest Pressure  . Back Pain    HPI Christina Berry is a 24 y.o. female history of migraines presenting today for evaluation of chest pain.  Patient notes that over the past 5 days she has had discomfort in her central lower chest.  Discomfort described as a pressure sensation.  She notes the initial 2 days she felt as if there was something stuck in her chest.  Will have episodic tightening at times.  Has had some occasional mild nausea.  Denies vomiting.  Denies abdominal pain.  Denies associated cough.  Denies shortness of breath, but does feel as if it is difficult to take a full deep breath.  She will also have an associated sensation going up her back with taking a deep breath.  She has been unable to sleep lying flat and is having to sleep in a reclined position.  She tried taking Tums without relief.  Denies history of hypertension, diabetes, tobacco use.  She denies cocaine use.  She does report family history of CAD/MI, but unsure of ages.  She denies prior DVT/PE.  Denies leg pain or leg swelling.  Denies recent travel immobilization.  Denies estrogen use.  Last menstrual cycles 3/10.  Denies recent injury trauma or increase in activity/heavy lifting.  HPI  Past Medical History:  Diagnosis Date  . Migraine   . Miscarriage within last 12 months   . Pregnant     Patient Active Problem List   Diagnosis Date Noted  . History of miscarriage 07/20/2019  . Migraine 07/20/2019    Past Surgical History:  Procedure Laterality Date  . adnoids      OB History    Gravida  1   Para      Term      Preterm      AB      Living  1     SAB      TAB      Ectopic      Multiple      Live Births               Home Medications    Prior to Admission medications   Medication Sig Start Date End Date Taking?  Authorizing Provider  busPIRone (BUSPAR) 5 MG tablet Take 1 tablet (5 mg total) by mouth 2 (two) times daily. 07/19/19   Kallie Locks, FNP  butalbital-acetaminophen-caffeine (FIORICET) [phone] MG tablet Take 1-2 tablets by mouth every 6 (six) hours as needed for headache. 07/19/19 07/18/20  Kallie Locks, FNP  ibuprofen (ADVIL) 800 MG tablet Take 1 tablet (800 mg total) by mouth every 8 (eight) hours as needed for headache. 07/19/19   Kallie Locks, FNP  naproxen (NAPROSYN) 500 MG tablet Take 1 tablet (500 mg total) by mouth 2 (two) times daily. 12/02/19   Rhea Thrun C, PA-C  traZODone (DESYREL) 50 MG tablet Take 1 tablet (50 mg total) by mouth at bedtime as needed for sleep. Patient not taking: Reported on 07/19/2019 12/28/15   Linna Hoff, MD    Family History History reviewed. No pertinent family history.  Social History Social History   Tobacco Use  . Smoking status: Never Smoker  . Smokeless tobacco: Never Used  Substance Use Topics  . Alcohol use: No  . Drug  use: No     Allergies   Cat hair extract and Seasonal ic  [cholestatin]   Review of Systems Review of Systems  Constitutional: Negative for activity change, appetite change, chills, fatigue and fever.  HENT: Negative for congestion, ear pain, rhinorrhea, sinus pressure, sore throat and trouble swallowing.   Eyes: Negative for discharge and redness.  Respiratory: Negative for cough, chest tightness and shortness of breath.   Cardiovascular: Positive for chest pain. Negative for leg swelling.  Gastrointestinal: Positive for nausea. Negative for abdominal pain, diarrhea and vomiting.  Musculoskeletal: Negative for myalgias.  Skin: Negative for rash.  Neurological: Negative for dizziness, light-headedness and headaches.     Physical Exam Triage Vital Signs ED Triage Vitals  Enc Vitals Group     BP 12/02/19 1816 124/81     Pulse Rate 12/02/19 1816 82     Resp 12/02/19 1816 18     Temp  12/02/19 1816 98.8 F (37.1 C)     Temp Source 12/02/19 1816 Oral     SpO2 12/02/19 1816 100 %     Weight --      Height --      Head Circumference --      Peak Flow --      Pain Score 12/02/19 1814 6     Pain Loc --      Pain Edu? --      Excl. in [place]? --    No data found.  Updated Vital Signs BP 124/81 (BP Location: Right Arm)   Pulse 82   Temp 98.8 F (37.1 C) (Oral)   Resp 18   LMP 11/13/2019   SpO2 100%   Visual Acuity Right Eye Distance:   Left Eye Distance:   Bilateral Distance:    Right Eye Near:   Left Eye Near:    Bilateral Near:     Physical Exam Vitals and nursing note reviewed.  Constitutional:      General: She is not in acute distress.    Appearance: She is well-developed.  HENT:     Head: Normocephalic and atraumatic.     Mouth/Throat:     Comments: Oral mucosa pink and moist, no tonsillar enlargement or exudate. Posterior pharynx patent and nonerythematous, no uvula deviation or swelling. Normal phonation. Eyes:     Conjunctiva/sclera: Conjunctivae normal.  Cardiovascular:     Rate and Rhythm: Normal rate and regular rhythm.     Heart sounds: No murmur.  Pulmonary:     Effort: Pulmonary effort is normal. No respiratory distress.     Breath sounds: Normal breath sounds.     Comments: Breathing comfortably at rest, CTABL, no wheezing, rales or other adventitious sounds auscultated Abdominal:     Palpations: Abdomen is soft.     Tenderness: There is no abdominal tenderness.     Comments: Soft, nondistended, nontender to light and deep palpation throughout all 4 quadrants and epigastrium.  Musculoskeletal:     Cervical back: Neck supple.     Comments: Bilateral lower leg symmetric, no calf tenderness or overlying erythema  Skin:    General: Skin is warm and dry.  Neurological:     Mental Status: She is alert.      UC Treatments / Results  Labs (all labs ordered are listed, but only abnormal results are displayed) Labs Reviewed - No data  to display  EKG   Radiology DG Chest 2 View  Result Date: 12/02/2019 CLINICAL DATA:  Central chest discomfort for 5 days,  orthopnea EXAM: CHEST - 2 VIEW COMPARISON:  None. FINDINGS: The heart size and mediastinal contours are within normal limits. Both lungs are clear. The visualized skeletal structures are unremarkable. IMPRESSION: No active cardiopulmonary disease. Electronically Signed   By: Sharlet Salina M.D.   On: 12/02/2019 19:20    Procedures Procedures (including critical care time)  Medications Ordered in UC Medications  alum & mag hydroxide-simeth (MAALOX/MYLANTA) 200-200-20 MG/5ML suspension 30 mL (30 mLs Oral Given 12/02/19 1855)    And  lidocaine (XYLOCAINE) 2 % viscous mouth solution 15 mL (15 mLs Oral Given 12/02/19 1855)    Initial Impression / Assessment and Plan / UC Course  I have reviewed the triage vital signs and the nursing notes.  Pertinent labs & imaging results that were available during my care of the patient were reviewed by me and considered in my medical decision making (see chart for details).     EKG normal sinus rhythm with sinus arrhythmia, no acute signs of ischemia or infarction. GI cocktail provided without significant improvement of symptoms. Obtaining chest x-ray given reported worsening with lying flat; x-ray negative for acute abnormality.  Unclear cause of symptoms at this time, not reproducible to touch, but will treat for any chest wall discomfort/MSK cause with Naprosyn twice daily with close monitoring.  Follow-up with primary care.  Low risk for ACS/DVT.  Discussed strict return precautions. Patient verbalized understanding and is agreeable with plan.    Final Clinical Impressions(s) / UC Diagnoses   Final diagnoses:  Atypical chest pain     Discharge Instructions     Chest xray and EKG normal Try Naprosyn twice daily with food consistently for the next 7 to 10 days May continue to try acid/reflux medicine  If developing  worsening symptoms, increased pain, difficulty breathing, dizziness or lightheadedness please follow-up in emergency room If persistent please follow-up with primary care    ED Prescriptions    Medication Sig Dispense Auth. Provider   naproxen (NAPROSYN) 500 MG tablet Take 1 tablet (500 mg total) by mouth 2 (two) times daily. 30 tablet Osborne Serio, [place] C, PA-C     PDMP not reviewed this encounter.   Lew Dawes, [place] 12/02/19 2029

## 2019-12-02 NOTE — ED Notes (Signed)
Patient texting while I was obtaining EKG

## 2019-12-03 ENCOUNTER — Encounter: Payer: Self-pay | Admitting: Family Medicine

## 2019-12-03 ENCOUNTER — Ambulatory Visit (INDEPENDENT_AMBULATORY_CARE_PROVIDER_SITE_OTHER): Payer: Medicaid Other | Admitting: Family Medicine

## 2019-12-03 VITALS — BP 107/79 | HR 84 | Temp 98.2°F | Ht 69.0 in | Wt 159.8 lb

## 2019-12-03 DIAGNOSIS — Z09 Encounter for follow-up examination after completed treatment for conditions other than malignant neoplasm: Secondary | ICD-10-CM | POA: Diagnosis not present

## 2019-12-03 DIAGNOSIS — R0789 Other chest pain: Secondary | ICD-10-CM | POA: Diagnosis not present

## 2019-12-03 DIAGNOSIS — F419 Anxiety disorder, unspecified: Secondary | ICD-10-CM

## 2019-12-03 DIAGNOSIS — Z7689 Persons encountering health services in other specified circumstances: Secondary | ICD-10-CM

## 2019-12-03 DIAGNOSIS — Z0000 Encounter for general adult medical examination without abnormal findings: Secondary | ICD-10-CM

## 2019-12-03 NOTE — Progress Notes (Signed)
Patient [facility] Follow Up  Subjective:  Patient ID: Christina Berry, female    DOB: Dec 30, 1995  Age: 24 y.o. MRN: [id number]  CC:  Chief Complaint  Patient presents with  . Follow-up    Mid chest pain & Upper back pain    HPI Christina Berry is a 24 year old female who presents for Hospital Follow Up today.   Past Medical History:  Diagnosis Date  . Anxiety   . Migraine   . Miscarriage within last 12 months   . Pregnant    Current Status: Since her last office visit, she has had an Urgent Care visit for Atypical  Chest Pain. Today, she is doing well with no complaints. She continues to have medial chest discomfort X 6 days now. She states that her chest continues to be sore, and she experiences shortness of breath when lying down. Her anxiety states that she has recently relocated from her parent's home to her fiance's home. This is her 38 year old's father. She is currently back with her parents for a week to sort some things out. She has been out of Buspirone for about 1 month. Patient does have history of seeing a Psychiatrist. She denies fevers, chills, fatigue, recent infections, weight loss, and night sweats. She has not had any headaches, visual changes, dizziness, and falls. No chest pain, heart palpitations, cough and shortness of breath reported. No reports of GI problems such as nausea, vomiting, diarrhea, and constipation. She has no reports of blood in stools, dysuria and hematuria. No depression or anxiety, and denies suicidal ideations, homicidal ideations, or auditory hallucinations. She is taking all medications as prescribed. She denies pain today.   Past Surgical History:  Procedure Laterality Date  . adnoids      History reviewed. No pertinent family history.  Social History   Socioeconomic History  . Marital status: Single    Spouse name: Not on file  . Number of children: Not on file  . Years of education:  Not on file  . Highest education level: Not on file  Occupational History  . Not on file  Tobacco Use  . Smoking status: Never Smoker  . Smokeless tobacco: Never Used  Substance and Sexual Activity  . Alcohol use: No  . Drug use: No  . Sexual activity: Yes    Partners: Male    Birth control/protection: Patch  Other Topics Concern  . Not on file  Social History Narrative  . Not on file   Social Determinants of Health   Financial Resource Strain:   . Difficulty of Paying Living Expenses:   Food Insecurity:   . Worried About Charity fundraiser in the Last Year:   . Arboriculturist in the Last Year:   Transportation Needs:   . Film/video editor (Medical):   Marland Kitchen Lack of Transportation (Non-Medical):   Physical Activity:   . Days of Exercise per Week:   . Minutes of Exercise per Session:   Stress:   . Feeling of Stress :   Social Connections:   . Frequency of Communication with Friends and Family:   . Frequency of Social Gatherings with Friends and Family:   . Attends Religious Services:   . Active Member of Clubs or Organizations:   . Attends Archivist Meetings:   Marland Kitchen Marital Status:   Intimate Partner Violence:   . Fear of Current or Ex-Partner:   .  Emotionally Abused:   Marland Kitchen Physically Abused:   . Sexually Abused:     Outpatient Medications Prior to Visit  Medication Sig Dispense Refill  . butalbital-acetaminophen-caffeine (FIORICET) 50-325-40 MG tablet Take 1-2 tablets by mouth every 6 (six) hours as needed for headache. 20 tablet 0  . ibuprofen (ADVIL) 800 MG tablet Take 1 tablet (800 mg total) by mouth every 8 (eight) hours as needed for headache. 30 tablet 3  . traZODone (DESYREL) 50 MG tablet Take 1 tablet (50 mg total) by mouth at bedtime as needed for sleep. 10 tablet 1  . busPIRone (BUSPAR) 5 MG tablet Take 1 tablet (5 mg total) by mouth 2 (two) times daily. (Patient not taking: Reported on 12/03/2019) 60 tablet 2  . naproxen (NAPROSYN) 500 MG tablet  Take 1 tablet (500 mg total) by mouth 2 (two) times daily. (Patient not taking: Reported on 12/03/2019) 30 tablet 0   No facility-administered medications prior to visit.    Allergies  Allergen Reactions  . Cat Hair Extract Swelling  . Seasonal Ic  [Cholestatin] Swelling    ROS Review of Systems  Constitutional: Negative.   HENT: Negative.   Eyes: Negative.   Respiratory: Positive for shortness of breath (occasional).   Cardiovascular: Negative.        Occasional chest discomfort  Gastrointestinal: Negative.   Endocrine: Negative.   Genitourinary: Negative.   Musculoskeletal: Negative.   Skin: Negative.   Allergic/Immunologic: Negative.   Neurological: Negative.   Hematological: Negative.   Psychiatric/Behavioral:       Increased anxiety      Objective:    Physical Exam  Constitutional: She is oriented to person, place, and time. She appears well-developed and well-nourished.  HENT:  Head: Normocephalic and atraumatic.  Eyes: Conjunctivae are normal.  Cardiovascular: Normal rate, regular rhythm, normal heart sounds and intact distal pulses.  Pulmonary/Chest: Effort normal and breath sounds normal.  Abdominal: Soft. Bowel sounds are normal.  Musculoskeletal:        General: Normal range of motion.     Cervical back: Normal range of motion and neck supple.  Neurological: She is alert and oriented to person, place, and time. She has normal reflexes.  Skin: Skin is warm and dry.  Psychiatric: She has a normal mood and affect. Her behavior is normal. Judgment and thought content normal.  Nursing note and vitals reviewed.   BP 107/79   Pulse 84   Temp 98.2 F (36.8 C)   Ht 5\' 9"  (1.753 m)   Wt 159 lb 12.8 oz (72.5 kg)   LMP 11/13/2019   SpO2 99%   BMI 23.60 kg/m  Wt Readings from Last 3 Encounters:  12/03/19 159 lb 12.8 oz (72.5 kg)  07/19/19 148 lb (67.1 kg)  06/02/17 138 lb (62.6 kg)     Health Maintenance Due  Topic Date Due  . HIV Screening  Never  done  . TETANUS/TDAP  Never done  . PAP-Cervical Cytology Screening  Never done  . PAP SMEAR-Modifier  Never done  . INFLUENZA VACCINE  Never done    There are no preventive care reminders to display for this patient.  No results found for: TSH Lab Results  Component Value Date   WBC 4.4 07/19/2019   HGB 13.2 07/19/2019   HCT 40.3 07/19/2019   MCV 91 07/19/2019   PLT 217 07/19/2019   Lab Results  Component Value Date   NA 139 07/19/2019   K 4.0 07/19/2019   CO2 22 07/19/2019  GLUCOSE 84 07/19/2019   BUN 10 07/19/2019   CREATININE 0.81 07/19/2019   BILITOT 0.4 07/19/2019   ALKPHOS 56 07/19/2019   AST 11 07/19/2019   ALT 6 07/19/2019   PROT 6.8 07/19/2019   ALBUMIN 4.5 07/19/2019   CALCIUM 9.7 07/19/2019   ANIONGAP 8 01/16/2017   No results found for: CHOL No results found for: HDL No results found for: LDLCALC No results found for: TRIG No results found for: CHOLHDL No results found for: IWLN9G    Assessment & Plan:   1. Hospital discharge follow-up  2. Encounter to establish care  3. Chest discomfort - Ambulatory referral to Cardiology  4. Anxiety Moderate today.  - Ambulatory referral to Cardiology  5. Healthcare maintenance - POCT urinalysis dipstick  6. Follow up She will follow up in 3 months.   No orders of the defined types were placed in this encounter.   Orders Placed This Encounter  Procedures  . Ambulatory referral to Cardiology  . Ambulatory referral to Psychiatry  . POCT urinalysis dipstick     Referral Orders     Ambulatory referral to Cardiology     Ambulatory referral to Psychiatry   Raliegh Ip,  MSN, FNP-BC [facility] [address] [place], [place] [address] [phone]- fax  Problem List Items Addressed This Visit    None    Visit Diagnoses    Hospital discharge follow-up    -  Primary   Encounter to establish care        Chest discomfort       Relevant Orders   Ambulatory referral to Cardiology   Anxiety       Relevant Orders   Ambulatory referral to Cardiology   Ambulatory referral to Psychiatry   Healthcare maintenance       Relevant Orders   POCT urinalysis dipstick   Follow up          No orders of the defined types were placed in this encounter.   Follow-up: Return in about 3 months (around 03/04/2020).    Kallie Locks, FNP

## 2019-12-04 DIAGNOSIS — R0789 Other chest pain: Secondary | ICD-10-CM | POA: Insufficient documentation

## 2019-12-04 DIAGNOSIS — F419 Anxiety disorder, unspecified: Secondary | ICD-10-CM | POA: Insufficient documentation

## 2019-12-05 ENCOUNTER — Telehealth: Payer: Self-pay

## 2019-12-05 NOTE — Telephone Encounter (Signed)
  Med Request Buspar & Trazodone.  Patient stated her Buspar was sent to the wrong pharmacy. And she is also needing  the Trazodone sent in as well.   Please advise.

## 2019-12-05 NOTE — Telephone Encounter (Signed)
  Med Request Buspar & Trazodone.  Patient stated her Buspar was sent to the wrong pharmacy. And she is also need the Trazodone sent as well.   Please advise.

## 2019-12-08 ENCOUNTER — Other Ambulatory Visit: Payer: Self-pay | Admitting: Family Medicine

## 2019-12-08 DIAGNOSIS — F419 Anxiety disorder, unspecified: Secondary | ICD-10-CM

## 2019-12-11 NOTE — Telephone Encounter (Signed)
Please send  to the [facility] in [place].

## 2019-12-12 ENCOUNTER — Telehealth: Payer: Self-pay | Admitting: Family Medicine

## 2019-12-12 ENCOUNTER — Telehealth: Payer: Self-pay

## 2019-12-12 ENCOUNTER — Other Ambulatory Visit: Payer: Self-pay | Admitting: Nurse Practitioner

## 2019-12-12 ENCOUNTER — Other Ambulatory Visit: Payer: Self-pay

## 2019-12-12 DIAGNOSIS — F419 Anxiety disorder, unspecified: Secondary | ICD-10-CM

## 2019-12-12 MED ORDER — BUSPIRONE HCL 5 MG PO TABS: 5.0000 mg | ORAL_TABLET | Freq: Two times a day (BID) | ORAL | 2 refills | Status: AC

## 2019-12-12 MED ORDER — TRAZODONE HCL 50 MG PO TABS: 50.0000 mg | ORAL_TABLET | Freq: Every evening | ORAL | 0 refills | Status: AC | PRN

## 2019-12-12 MED ORDER — TRAZODONE HCL 50 MG PO TABS: 50.0000 mg | ORAL_TABLET | Freq: Every evening | ORAL | 1 refills | Status: DC | PRN

## 2019-12-12 NOTE — Telephone Encounter (Signed)
I have sent Buspar but cannot seen Trazodone. You will have to submit the Rx. It needs to go to [facility] in [place].

## 2019-12-12 NOTE — Telephone Encounter (Signed)
Dorene Grebe, Today Crystal was able to take care of the Rx Trazodone. The task has been completed. Thank you.

## 2019-12-13 ENCOUNTER — Other Ambulatory Visit: Payer: Self-pay | Admitting: Family Medicine

## 2019-12-13 NOTE — Progress Notes (Unsigned)
Established Patient Office Visit  Subjective:  Patient ID: Christina Berry, female    DOB: Mar 24, 1996  Age: 24 y.o. MRN: [id number]  CC: No chief complaint on file.   HPI Christina Berry presents for ***  Past Medical History:  Diagnosis Date  . Anxiety   . Migraine   . Miscarriage within last 12 months   . Pregnant     Past Surgical History:  Procedure Laterality Date  . adnoids      No family history on file.  Social History   Socioeconomic History  . Marital status: Single    Spouse name: Not on file  . Number of children: Not on file  . Years of education: Not on file  . Highest education level: Not on file  Occupational History  . Not on file  Tobacco Use  . Smoking status: Never Smoker  . Smokeless tobacco: Never Used  Substance and Sexual Activity  . Alcohol use: No  . Drug use: No  . Sexual activity: Yes    Partners: Male    Birth control/protection: Patch  Other Topics Concern  . Not on file  Social History Narrative  . Not on file   Social Determinants of Health   Financial Resource Strain:   . Difficulty of Paying Living Expenses:   Food Insecurity:   . Worried About Programme researcher, broadcasting/film/video in the Last Year:   . Barista in the Last Year:   Transportation Needs:   . Freight forwarder (Medical):   Marland Kitchen Lack of Transportation (Non-Medical):   Physical Activity:   . Days of Exercise per Week:   . Minutes of Exercise per Session:   Stress:   . Feeling of Stress :   Social Connections:   . Frequency of Communication with Friends and Family:   . Frequency of Social Gatherings with Friends and Family:   . Attends Religious Services:   . Active Member of Clubs or Organizations:   . Attends Banker Meetings:   Marland Kitchen Marital Status:   Intimate Partner Violence:   . Fear of Current or Ex-Partner:   . Emotionally Abused:   Marland Kitchen Physically Abused:   . Sexually Abused:     Outpatient Medications Prior to Visit  Medication Sig Dispense  Refill  . busPIRone (BUSPAR) 5 MG tablet Take 1 tablet (5 mg total) by mouth 2 (two) times daily. 60 tablet 2  . butalbital-acetaminophen-caffeine (FIORICET) 50-325-40 MG tablet Take 1-2 tablets by mouth every 6 (six) hours as needed for headache. 20 tablet 0  . ibuprofen (ADVIL) 800 MG tablet Take 1 tablet (800 mg total) by mouth every 8 (eight) hours as needed for headache. 30 tablet 3  . naproxen (NAPROSYN) 500 MG tablet Take 1 tablet (500 mg total) by mouth 2 (two) times daily. (Patient not taking: Reported on 12/03/2019) 30 tablet 0  . traZODone (DESYREL) 50 MG tablet Take 1 tablet (50 mg total) by mouth at bedtime as needed for sleep. 10 tablet 0   No facility-administered medications prior to visit.    Allergies  Allergen Reactions  . Cat Hair Extract Swelling  . Seasonal Ic  [Cholestatin] Swelling    ROS Review of Systems    Objective:    Physical Exam  LMP 11/13/2019  Wt Readings from Last 3 Encounters:  12/03/19 159 lb 12.8 oz (72.5 kg)  07/19/19 148 lb (67.1 kg)  06/02/17 138 lb (62.6 kg)     Health Maintenance Due  Topic Date Due  . HIV Screening  Never done  . TETANUS/TDAP  Never done  . PAP-Cervical Cytology Screening  Never done  . PAP SMEAR-Modifier  Never done    There are no preventive care reminders to display for this patient.  No results found for: TSH Lab Results  Component Value Date   WBC 4.4 07/19/2019   HGB 13.2 07/19/2019   HCT 40.3 07/19/2019   MCV 91 07/19/2019   PLT 217 07/19/2019   Lab Results  Component Value Date   NA 139 07/19/2019   K 4.0 07/19/2019   CO2 22 07/19/2019   GLUCOSE 84 07/19/2019   BUN 10 07/19/2019   CREATININE 0.81 07/19/2019   BILITOT 0.4 07/19/2019   ALKPHOS 56 07/19/2019   AST 11 07/19/2019   ALT 6 07/19/2019   PROT 6.8 07/19/2019   ALBUMIN 4.5 07/19/2019   CALCIUM 9.7 07/19/2019   ANIONGAP 8 01/16/2017   No results found for: CHOL No results found for: HDL No results found for: LDLCALC No  results found for: TRIG No results found for: CHOLHDL No results found for: HGBA1C    Assessment & Plan:   Problem List Items Addressed This Visit    None      No orders of the defined types were placed in this encounter.   Follow-up: No follow-ups on file.    Azzie Glatter, FNP

## 2019-12-14 ENCOUNTER — Other Ambulatory Visit: Payer: Self-pay | Admitting: Family Medicine

## 2019-12-14 DIAGNOSIS — G4700 Insomnia, unspecified: Secondary | ICD-10-CM

## 2019-12-23 NOTE — Progress Notes (Deleted)
Cardiology Office Note:   Date:  12/23/2019  NAME:  Christina Berry    MRN: [id number] DOB:  [birth year]   PCP:  Kallie Locks, FNP  Cardiologist:  No primary care provider on file.  Electrophysiologist:  None   Referring MD: Kallie Locks, FNP   No chief complaint on file. ***  History of Present Illness:   Christina Berry is a 24 y.o. female with a hx of migraines who is being seen today for the evaluation of chest pain at the request of Kallie Locks, FNP.  Evaluated in the ER for atypical CP. EKG normal. Sent for PCP evaluation who then sent her to see [place].   Past Medical History: Past Medical History:  Diagnosis Date  . Anxiety   . Migraine   . Miscarriage within last 12 months   . Pregnant     Past Surgical History: Past Surgical History:  Procedure Laterality Date  . adnoids      Current Medications: No outpatient medications have been marked as taking for the 12/24/19 encounter (Appointment) with O'Neal, Ronnald Ramp, MD.     Allergies:    Cat hair extract and Seasonal ic  [cholestatin]   Social History: Social History   Socioeconomic History  . Marital status: Single    Spouse name: Not on file  . Number of children: Not on file  . Years of education: Not on file  . Highest education level: Not on file  Occupational History  . Not on file  Tobacco Use  . Smoking status: Never Smoker  . Smokeless tobacco: Never Used  Substance and Sexual Activity  . Alcohol use: No  . Drug use: No  . Sexual activity: Yes    Partners: Male    Birth control/protection: Patch  Other Topics Concern  . Not on file  Social History Narrative  . Not on file   Social Determinants of Health   Financial Resource Strain:   . Difficulty of Paying Living Expenses:   Food Insecurity:   . Worried About Programme researcher, broadcasting/film/video in the Last Year:   . Barista in the Last Year:   Transportation Needs:   . Freight forwarder (Medical):   Marland Kitchen Lack of Transportation  (Non-Medical):   Physical Activity:   . Days of Exercise per Week:   . Minutes of Exercise per Session:   Stress:   . Feeling of Stress :   Social Connections:   . Frequency of Communication with Friends and Family:   . Frequency of Social Gatherings with Friends and Family:   . Attends Religious Services:   . Active Member of Clubs or Organizations:   . Attends Banker Meetings:   Marland Kitchen Marital Status:      Family History: The patient's ***family history is not on file.  ROS:   All other ROS reviewed and negative. Pertinent positives noted in the HPI.     EKGs/Labs/Other Studies Reviewed:   The following studies were personally reviewed by me today:  EKG:  EKG is *** ordered today.  The ekg ordered today demonstrates ***, and was personally reviewed by me.   Recent Labs: 07/19/2019: ALT 6; BUN 10; Creatinine, Ser 0.81; Hemoglobin 13.2; Platelets 217; Potassium 4.0; Sodium 139   Recent Lipid Panel No results found for: CHOL, TRIG, HDL, CHOLHDL, VLDL, LDLCALC, LDLDIRECT  Physical Exam:   VS:  There were no vitals taken for this visit.   Wt Readings from Last  3 Encounters:  12/03/19 159 lb 12.8 oz (72.5 kg)  07/19/19 148 lb (67.1 kg)  06/02/17 138 lb (62.6 kg)    General: Well nourished, well developed, in no acute distress Heart: Atraumatic, normal size  Eyes: PEERLA, EOMI  Neck: Supple, no JVD Endocrine: No thryomegaly Cardiac: Normal S1, S2; RRR; no murmurs, rubs, or gallops Lungs: Clear to auscultation bilaterally, no wheezing, rhonchi or rales  Abd: Soft, nontender, no hepatomegaly  Ext: No edema, pulses 2+ Musculoskeletal: No deformities, BUE and BLE strength normal and equal Skin: Warm and dry, no rashes   Neuro: Alert and oriented to person, place, time, and situation, CNII-XII grossly intact, no focal deficits  Psych: Normal mood and affect   ASSESSMENT:   Christina Berry is a 24 y.o. female who presents for the following: No diagnosis  found.  PLAN:   There are no diagnoses linked to this encounter.  Disposition: No follow-ups on file.  Medication Adjustments/Labs and Tests Ordered: Current medicines are reviewed at length with the patient today.  Concerns regarding medicines are outlined above.  No orders of the defined types were placed in this encounter.  No orders of the defined types were placed in this encounter.   There are no Patient Instructions on file for this visit.   Time Spent with Patient: I have spent a total of *** minutes with patient reviewing hospital notes, telemetry, EKGs, labs and examining the patient as well as establishing an assessment and plan that was discussed with the patient.  > 50% of time was spent in direct patient care.  Signed, Addison Naegeli. Audie Box, [place]  [address], [place], [place] [address] [phone]  12/23/2019 1:31 PM

## 2019-12-24 ENCOUNTER — Ambulatory Visit: Payer: Medicaid Other | Admitting: Cardiovascular Disease

## 2019-12-24 DIAGNOSIS — R079 Chest pain, unspecified: Secondary | ICD-10-CM

## 2020-01-10 ENCOUNTER — Encounter: Payer: Self-pay | Admitting: General Practice

## 2020-03-20 ENCOUNTER — Ambulatory Visit: Payer: Medicaid Other | Admitting: Family Medicine

## 2020-11-12 IMAGING — DX DG CHEST 2V
2 series · 2 of 2 positions shown · non-contrast
Comparison: None.

CLINICAL DATA: Central chest discomfort for 5 days, orthopnea

EXAM:
CHEST - 2 VIEW

[chest pa]
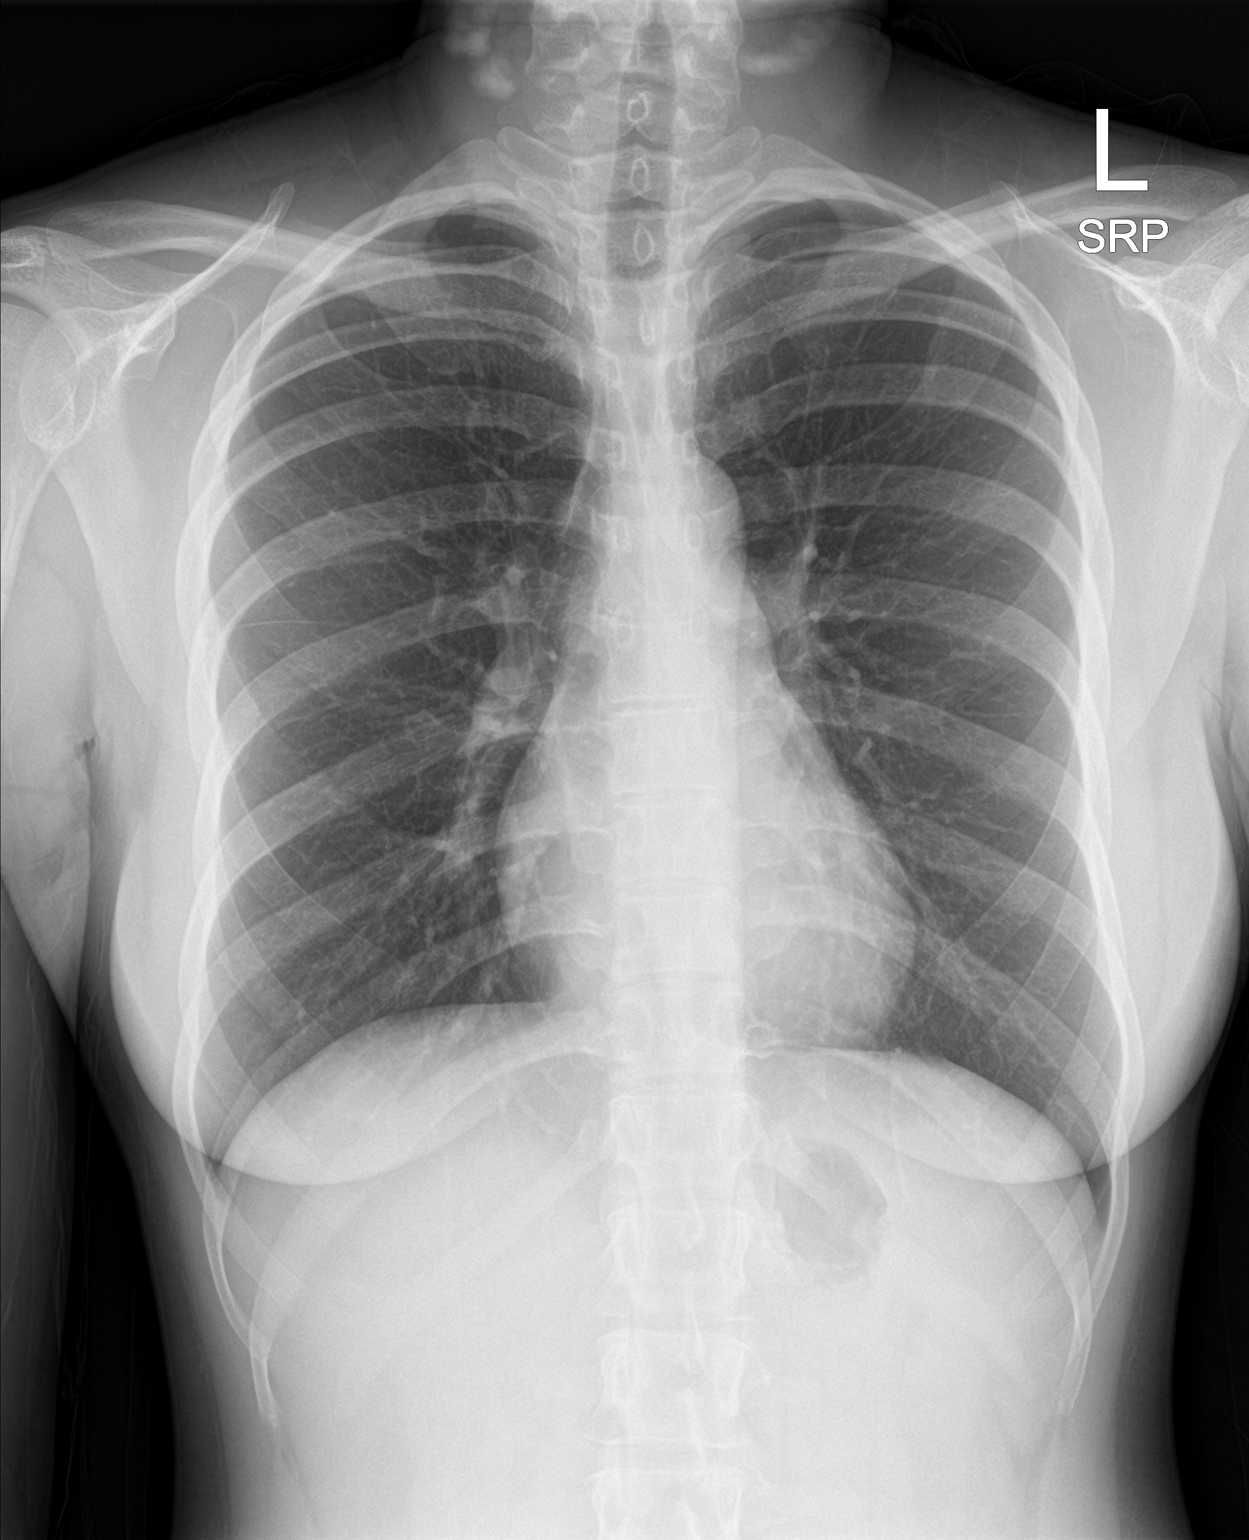

[chest lat]
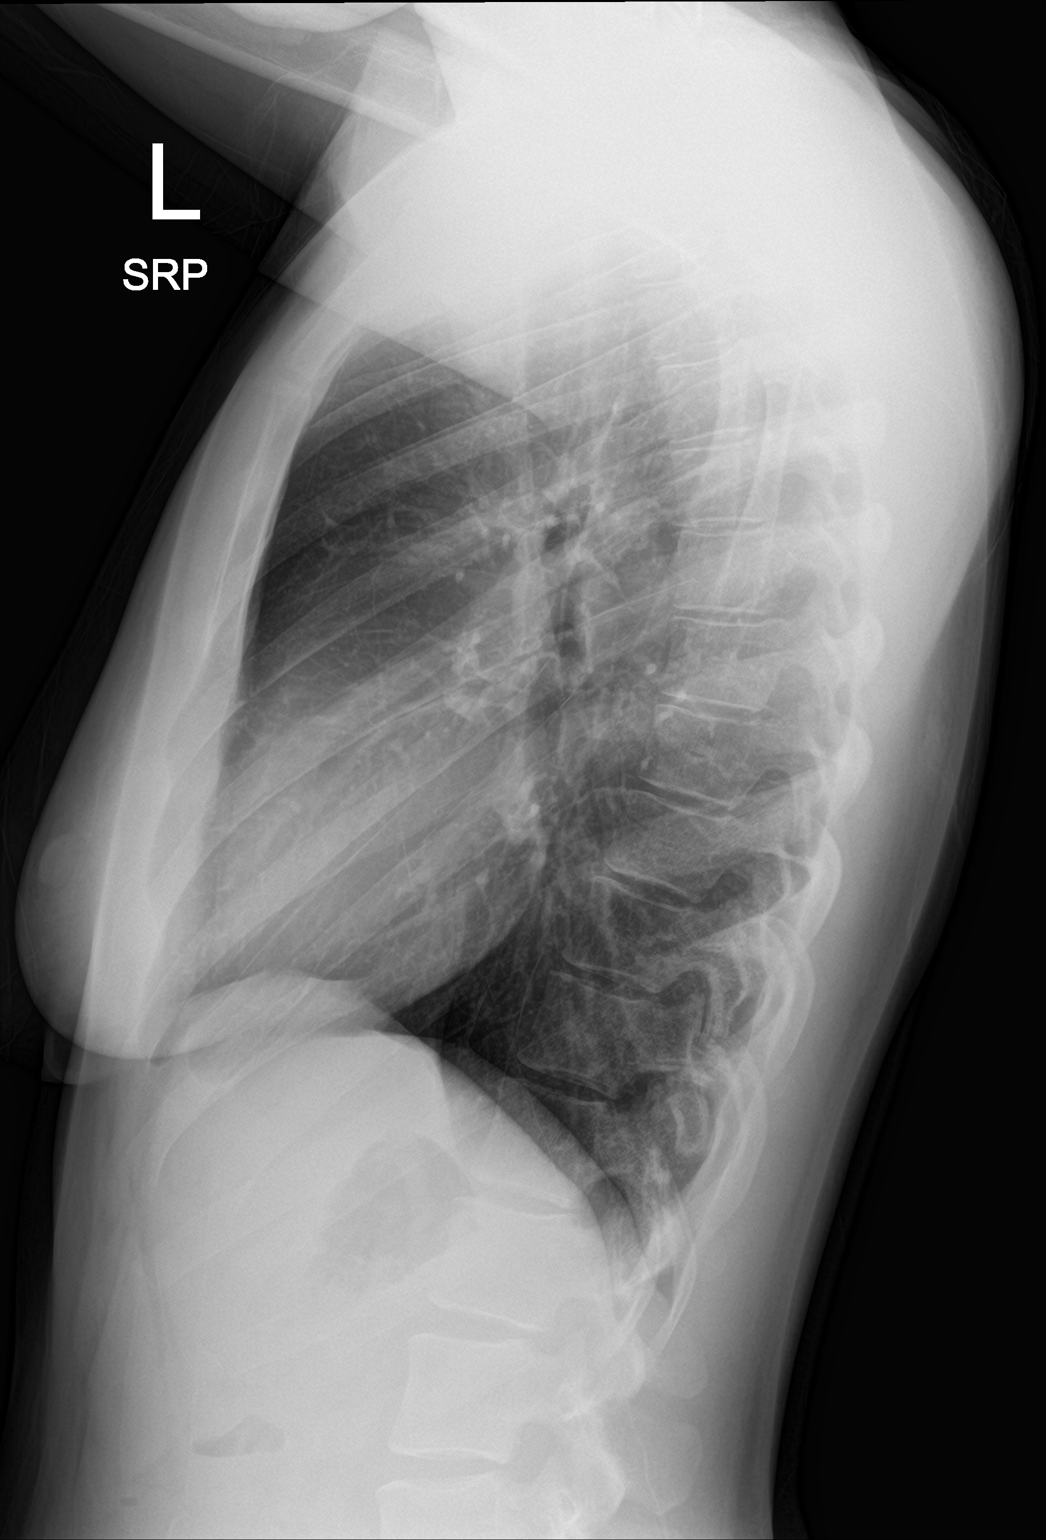

[2 of 2 positions shown; findings below may reference images not displayed]

FINDINGS: The heart size and mediastinal contours are within normal limits.
Both lungs are clear. The visualized skeletal structures are
unremarkable.
IMPRESSION: No active cardiopulmonary disease.
